# Patient Record
Sex: Male | Born: 1980 | Race: White | Hispanic: No | Marital: Single | Smoking: Never smoker
Health system: Southern US, Community
[De-identification: ages and names within clinical notes are randomized; demographics above are authoritative.]

## PROBLEM LIST (undated history)

## (undated) DIAGNOSIS — F419 Anxiety disorder, unspecified: Secondary | ICD-10-CM

## (undated) DIAGNOSIS — D649 Anemia, unspecified: Secondary | ICD-10-CM

## (undated) DIAGNOSIS — Z8709 Personal history of other diseases of the respiratory system: Secondary | ICD-10-CM

## (undated) DIAGNOSIS — I10 Essential (primary) hypertension: Secondary | ICD-10-CM

## (undated) DIAGNOSIS — R112 Nausea with vomiting, unspecified: Secondary | ICD-10-CM

## (undated) DIAGNOSIS — A498 Other bacterial infections of unspecified site: Secondary | ICD-10-CM

## (undated) DIAGNOSIS — Z87442 Personal history of urinary calculi: Secondary | ICD-10-CM

## (undated) DIAGNOSIS — K589 Irritable bowel syndrome without diarrhea: Secondary | ICD-10-CM

## (undated) DIAGNOSIS — K900 Celiac disease: Secondary | ICD-10-CM

## (undated) DIAGNOSIS — K5790 Diverticulosis of intestine, part unspecified, without perforation or abscess without bleeding: Secondary | ICD-10-CM

## (undated) DIAGNOSIS — Z9889 Other specified postprocedural states: Secondary | ICD-10-CM

## (undated) HISTORY — PX: CHOLECYSTECTOMY: SHX55

## (undated) HISTORY — PX: TONSILLECTOMY: SUR1361

## (undated) HISTORY — PX: LITHOTRIPSY: SUR834

---

## 2002-04-30 ENCOUNTER — Emergency Department (HOSPITAL_COMMUNITY): Admission: EM | Admit: 2002-04-30 | Discharge: 2002-04-30 | Payer: Self-pay | Admitting: Emergency Medicine

## 2002-05-26 ENCOUNTER — Emergency Department (HOSPITAL_COMMUNITY): Admission: EM | Admit: 2002-05-26 | Discharge: 2002-05-26 | Payer: Self-pay | Admitting: Emergency Medicine

## 2002-05-26 ENCOUNTER — Encounter: Payer: Self-pay | Admitting: Emergency Medicine

## 2003-05-01 ENCOUNTER — Emergency Department (HOSPITAL_COMMUNITY): Admission: EM | Admit: 2003-05-01 | Discharge: 2003-05-01 | Payer: Self-pay | Admitting: Emergency Medicine

## 2003-06-24 ENCOUNTER — Ambulatory Visit (HOSPITAL_COMMUNITY): Admission: RE | Admit: 2003-06-24 | Discharge: 2003-06-24 | Payer: Self-pay | Admitting: *Deleted

## 2003-06-24 ENCOUNTER — Encounter (INDEPENDENT_AMBULATORY_CARE_PROVIDER_SITE_OTHER): Payer: Self-pay | Admitting: Specialist

## 2003-06-24 ENCOUNTER — Ambulatory Visit (HOSPITAL_BASED_OUTPATIENT_CLINIC_OR_DEPARTMENT_OTHER): Admission: RE | Admit: 2003-06-24 | Discharge: 2003-06-24 | Payer: Self-pay | Admitting: *Deleted

## 2003-09-22 ENCOUNTER — Emergency Department (HOSPITAL_COMMUNITY): Admission: EM | Admit: 2003-09-22 | Discharge: 2003-09-22 | Payer: Self-pay | Admitting: Emergency Medicine

## 2003-11-26 ENCOUNTER — Emergency Department (HOSPITAL_COMMUNITY): Admission: EM | Admit: 2003-11-26 | Discharge: 2003-11-26 | Payer: Self-pay | Admitting: *Deleted

## 2004-06-14 ENCOUNTER — Emergency Department (HOSPITAL_COMMUNITY): Admission: EM | Admit: 2004-06-14 | Discharge: 2004-06-14 | Payer: Self-pay | Admitting: Emergency Medicine

## 2004-07-06 ENCOUNTER — Emergency Department (HOSPITAL_COMMUNITY): Admission: EM | Admit: 2004-07-06 | Discharge: 2004-07-06 | Payer: Self-pay | Admitting: Family Medicine

## 2004-07-27 ENCOUNTER — Emergency Department (HOSPITAL_COMMUNITY): Admission: EM | Admit: 2004-07-27 | Discharge: 2004-07-27 | Payer: Self-pay | Admitting: Family Medicine

## 2007-02-23 ENCOUNTER — Ambulatory Visit: Payer: Self-pay | Admitting: Emergency Medicine

## 2008-01-18 ENCOUNTER — Ambulatory Visit: Payer: Self-pay | Admitting: Family Medicine

## 2008-10-07 ENCOUNTER — Ambulatory Visit: Payer: Self-pay | Admitting: Nurse Practitioner

## 2008-10-13 ENCOUNTER — Emergency Department: Payer: Self-pay | Admitting: Emergency Medicine

## 2009-02-21 ENCOUNTER — Emergency Department: Payer: Self-pay | Admitting: Emergency Medicine

## 2009-10-12 ENCOUNTER — Emergency Department (HOSPITAL_COMMUNITY): Admission: EM | Admit: 2009-10-12 | Discharge: 2009-10-12 | Payer: Self-pay | Admitting: Emergency Medicine

## 2009-11-09 ENCOUNTER — Ambulatory Visit: Payer: Self-pay | Admitting: Internal Medicine

## 2010-09-08 LAB — D-DIMER, QUANTITATIVE: D-Dimer, Quant: 0.22 ug/mL-FEU (ref 0.00–0.48)

## 2010-11-06 NOTE — Op Note (Signed)
NAME:  Tyler Reynolds, Tyler Reynolds NO.:  [id number]   MEDICAL RECORD NO.:  [id number]                   PATIENT TYPE:  AMB   LOCATION:  DSC                                  FACILITY:  [facility]   PHYSICIAN:  Kathy Breach, M.D.                   DATE OF BIRTH:  26-Oct-1980   DATE OF PROCEDURE:  06/24/2003  DATE OF DISCHARGE:                                 OPERATIVE REPORT   PREOPERATIVE DIAGNOSIS:  Chronic adenotonsillitis.   POSTOPERATIVE DIAGNOSIS:  Chronic adenotonsillitis.   OPERATION PERFORMED:  Adenotonsillectomy.   SURGEON:  Kathy Breach, M.D.   ANESTHESIA:  General orotracheal anesthesia.   DESCRIPTION OF PROCEDURE:  With the patient under general orotracheal  anesthesia, the Crowe-Davis mouth gag was inserted and the patient put in  rose position.  The patient had 2+ enlarged tonsils, deeply cryptic, filled  with multiple tonsilloliths.  Soft palate was intact to normal  visualization.  Mirror examination of the nasopharynx revealed extensive  cobblestoning adenoid tissue present.  The left tonsil was grasped at the  superior pole and removed by electrical dissection obtaining complete  hemostasis with electrocauterization.  The right tonsil was then removed in  similar fashion.  The red rubber catheter would not pass on the left side of  his nose and was passed on the right side and used to elevate the soft  palate.  Under mirror visualization with suction cauterization, the adenoid  tissue was removed with ablation by electrocoagulation maintaining complete  hemostasis.  Blood loss for the procedure estimated at less than 30 mL.  The  patient tolerated the procedure well and was taken to the recovery room in  stable general condition.                                               Kathy Breach, M.D.    Venia Minks  D:  06/24/2003  T:  06/24/2003  Job:  [id number]

## 2011-02-06 ENCOUNTER — Ambulatory Visit: Payer: Self-pay | Admitting: Internal Medicine

## 2011-06-04 ENCOUNTER — Ambulatory Visit: Payer: Self-pay

## 2011-06-24 ENCOUNTER — Ambulatory Visit: Payer: Self-pay | Admitting: Urology

## 2011-07-08 ENCOUNTER — Ambulatory Visit: Payer: Self-pay | Admitting: Urology

## 2011-07-09 ENCOUNTER — Emergency Department: Payer: Self-pay | Admitting: Emergency Medicine

## 2011-07-09 LAB — CBC
HGB: 14 g/dL (ref 13.0–18.0)
MCH: 30.6 pg (ref 26.0–34.0)
MCHC: 35 g/dL (ref 32.0–36.0)
Platelet: 197 10*3/uL (ref 150–440)
RDW: 13.1 % (ref 11.5–14.5)
WBC: 9.4 10*3/uL (ref 3.8–10.6)

## 2011-07-09 LAB — COMPREHENSIVE METABOLIC PANEL
Albumin: 4.4 g/dL (ref 3.4–5.0)
Alkaline Phosphatase: 53 U/L (ref 50–136)
Anion Gap: 13 (ref 7–16)
BUN: 18 mg/dL (ref 7–18)
Calcium, Total: 9 mg/dL (ref 8.5–10.1)
Co2: 26 mmol/L (ref 21–32)
EGFR (African American): >60
EGFR (Non-African Amer.): >60
Osmolality: 292 (ref 275–301)
Potassium: 3.8 mmol/L (ref 3.5–5.1)
SGOT(AST): 30 U/L (ref 15–37)
SGPT (ALT): 56 U/L
Sodium: 145 mmol/L (ref 136–145)

## 2011-07-10 LAB — URINALYSIS, COMPLETE
Glucose,UR: NEGATIVE mg/dL (ref 0–75)
Leukocyte Esterase: NEGATIVE
Ph: 5 (ref 4.5–8.0)
Protein: 30
RBC,UR: 38 /HPF (ref 0–5)
WBC UR: 6 /HPF (ref 0–5)

## 2011-07-15 ENCOUNTER — Ambulatory Visit: Payer: Self-pay | Admitting: Urology

## 2011-11-22 ENCOUNTER — Other Ambulatory Visit: Payer: Self-pay | Admitting: Family Medicine

## 2011-11-22 DIAGNOSIS — R634 Abnormal weight loss: Secondary | ICD-10-CM

## 2011-11-22 DIAGNOSIS — R109 Unspecified abdominal pain: Secondary | ICD-10-CM

## 2011-11-23 ENCOUNTER — Ambulatory Visit
Admission: RE | Admit: 2011-11-23 | Discharge: 2011-11-23 | Disposition: A | Discharge status: Home / Self Care | Payer: 59 | Source: Ambulatory Visit | Attending: Family Medicine | Admitting: Family Medicine

## 2011-11-23 DIAGNOSIS — R634 Abnormal weight loss: Secondary | ICD-10-CM

## 2011-11-23 DIAGNOSIS — R109 Unspecified abdominal pain: Secondary | ICD-10-CM

## 2011-11-23 MED ORDER — IOHEXOL 300 MG/ML  SOLN
125.0000 mL | Freq: Once | INTRAMUSCULAR | Status: AC | PRN
  Administered 2011-11-23: 125 mL via INTRAVENOUS

## 2011-11-23 MED ORDER — IOHEXOL 300 MG/ML  SOLN
20.0000 mL | Freq: Once | INTRAMUSCULAR | Status: AC | PRN
  Administered 2011-11-23: 20 mL via ORAL

## 2011-12-11 ENCOUNTER — Telehealth: Payer: Self-pay

## 2011-12-11 NOTE — Telephone Encounter (Signed)
Yes it is and details were given of the report

## 2011-12-11 NOTE — Telephone Encounter (Signed)
PT WOULD LIKE TO KNOW IF HIS RADIOLOGY REPORT IN YET, PT WAS SEEN HERE FOR WC YESTERDAY.

## 2011-12-19 ENCOUNTER — Ambulatory Visit: Payer: Self-pay

## 2012-01-10 ENCOUNTER — Encounter: Payer: Self-pay | Admitting: Emergency Medicine

## 2012-06-21 DIAGNOSIS — A498 Other bacterial infections of unspecified site: Secondary | ICD-10-CM

## 2012-06-21 HISTORY — DX: Other bacterial infections of unspecified site: A49.8

## 2012-10-26 ENCOUNTER — Encounter (HOSPITAL_COMMUNITY): Payer: Self-pay | Admitting: Nurse Practitioner

## 2012-10-26 ENCOUNTER — Emergency Department (HOSPITAL_COMMUNITY)
Admission: EM | Admit: 2012-10-26 | Discharge: 2012-10-26 | Disposition: A | Discharge status: Home / Self Care | Payer: BC Managed Care – PPO | Attending: Emergency Medicine | Admitting: Emergency Medicine

## 2012-10-26 ENCOUNTER — Emergency Department (HOSPITAL_COMMUNITY): Payer: BC Managed Care – PPO

## 2012-10-26 DIAGNOSIS — Z9089 Acquired absence of other organs: Secondary | ICD-10-CM | POA: Insufficient documentation

## 2012-10-26 DIAGNOSIS — R197 Diarrhea, unspecified: Secondary | ICD-10-CM | POA: Insufficient documentation

## 2012-10-26 DIAGNOSIS — M25529 Pain in unspecified elbow: Secondary | ICD-10-CM | POA: Insufficient documentation

## 2012-10-26 DIAGNOSIS — R109 Unspecified abdominal pain: Secondary | ICD-10-CM

## 2012-10-26 DIAGNOSIS — M25569 Pain in unspecified knee: Secondary | ICD-10-CM | POA: Insufficient documentation

## 2012-10-26 DIAGNOSIS — R6883 Chills (without fever): Secondary | ICD-10-CM | POA: Insufficient documentation

## 2012-10-26 DIAGNOSIS — R112 Nausea with vomiting, unspecified: Secondary | ICD-10-CM | POA: Insufficient documentation

## 2012-10-26 DIAGNOSIS — R5381 Other malaise: Secondary | ICD-10-CM | POA: Insufficient documentation

## 2012-10-26 DIAGNOSIS — Z8719 Personal history of other diseases of the digestive system: Secondary | ICD-10-CM | POA: Insufficient documentation

## 2012-10-26 HISTORY — DX: Celiac disease: K90.0

## 2012-10-26 HISTORY — DX: Diverticulosis of intestine, part unspecified, without perforation or abscess without bleeding: K57.90

## 2012-10-26 HISTORY — DX: Irritable bowel syndrome, unspecified: K58.9

## 2012-10-26 LAB — CBC WITH DIFFERENTIAL/PLATELET
Eosinophils Absolute: 0.2 10*3/uL (ref 0.0–0.7)
Eosinophils Relative: 2 % (ref 0–5)
HCT: 41.8 % (ref 39.0–52.0)
Hemoglobin: 15.3 g/dL (ref 13.0–17.0)
Lymphs Abs: 2.8 10*3/uL (ref 0.7–4.0)
MCH: 31 pg (ref 26.0–34.0)
MCV: 84.6 fL (ref 78.0–100.0)
Monocytes Absolute: 0.7 10*3/uL (ref 0.1–1.0)
Monocytes Relative: 7 % (ref 3–12)
RBC: 4.94 MIL/uL (ref 4.22–5.81)

## 2012-10-26 LAB — URINALYSIS, ROUTINE W REFLEX MICROSCOPIC
Glucose, UA: NEGATIVE mg/dL
Leukocytes, UA: NEGATIVE
Protein, ur: NEGATIVE mg/dL
Specific Gravity, Urine: <1.005 — ABNORMAL LOW (ref 1.005–1.030)
pH: 5.5 (ref 5.0–8.0)

## 2012-10-26 LAB — COMPREHENSIVE METABOLIC PANEL
AST: 32 U/L (ref 0–37)
Albumin: 4.7 g/dL (ref 3.5–5.2)
Calcium: 9.7 mg/dL (ref 8.4–10.5)
Chloride: 103 mEq/L (ref 96–112)
Creatinine, Ser: 1.12 mg/dL (ref 0.50–1.35)
Sodium: 142 mEq/L (ref 135–145)

## 2012-10-26 MED ORDER — IOHEXOL 300 MG/ML  SOLN
50.0000 mL | Freq: Once | INTRAMUSCULAR | Status: AC | PRN
  Administered 2012-10-26: 50 mL via ORAL

## 2012-10-26 MED ORDER — ONDANSETRON HCL 4 MG/2ML IJ SOLN
4.0000 mg | Freq: Once | INTRAMUSCULAR | Status: AC
  Administered 2012-10-26: 4 mg via INTRAVENOUS
  Filled 2012-10-26: qty 2

## 2012-10-26 MED ORDER — PROMETHAZINE HCL 25 MG/ML IJ SOLN
12.5000 mg | Freq: Once | INTRAMUSCULAR | Status: DC
  Filled 2012-10-26: qty 1

## 2012-10-26 MED ORDER — IOHEXOL 300 MG/ML  SOLN
100.0000 mL | Freq: Once | INTRAMUSCULAR | Status: AC | PRN
  Administered 2012-10-26: 100 mL via INTRAVENOUS

## 2012-10-26 MED ORDER — KETOROLAC TROMETHAMINE 30 MG/ML IJ SOLN
30.0000 mg | Freq: Once | INTRAMUSCULAR | Status: AC
  Administered 2012-10-26: 30 mg via INTRAVENOUS
  Filled 2012-10-26: qty 1

## 2012-10-26 MED ORDER — SODIUM CHLORIDE 0.9 % IV BOLUS (SEPSIS)
1000.0000 mL | Freq: Once | INTRAVENOUS | Status: AC
  Administered 2012-10-26: 1000 mL via INTRAVENOUS

## 2012-10-26 NOTE — ED Provider Notes (Signed)
History     CSN: [id number]  Arrival date & time 10/26/12  1256   First MD Initiated Contact with Patient 10/26/12 [phone]      Chief Complaint  Patient presents with  . Abdominal Pain    (Consider location/radiation/quality/duration/timing/severity/associated sxs/prior treatment) HPI Comments: Patient with hx celiac disease, IBS p/w with constant tearing, crampy left sided abdominal pain x 1 week with three episodes of vomiting - emesis described as yellow with red streaks.  Also profuse diarrhea 7-9 times daily.  States diarrhea is very watery, nonbloody.  Associated chills, weakness, decreased appetite, arthralgias (knees and elbows).  Has seen Dr Loreta Ave (GI), and has had recent colonoscopy that he reports was normal.  Hx of diverticulitis that he states felt similar but this episode is worse.  No recent antibiotics.  No sick contacts.   Patient is a 32 y.o. male presenting with abdominal pain. The history is provided by the patient.  Abdominal Pain Associated symptoms: chills, nausea and vomiting   Associated symptoms: no constipation, no diarrhea, no dysuria and no fever     Past Medical History  Diagnosis Date  . Irritable bowel syndrome (IBS)   . Diverticular disease   . Celiac disease     Past Surgical History  Procedure Laterality Date  . Cholecystectomy    . Tonsillectomy      History reviewed. No pertinent family history.  History  Substance Use Topics  . Smoking status: Never Smoker   . Smokeless tobacco: Not on file  . Alcohol Use: No      Review of Systems  Constitutional: Positive for chills. Negative for fever.  Gastrointestinal: Positive for nausea, vomiting and abdominal pain. Negative for diarrhea, constipation and blood in stool.  Genitourinary: Negative for dysuria, urgency, frequency, discharge, scrotal swelling and testicular pain.    Allergies  Antihistamines, diphenhydramine-type and Augmentin  Home Medications   Current Outpatient Rx    Name  Route  Sig  Dispense  Refill  . vitamin B-12 (CYANOCOBALAMIN) 1000 MCG tablet   Oral   Take 1,000 mcg by mouth daily.           BP 156/100  Pulse 67  Temp(Src) 98.5 F (36.9 C) (Oral)  Resp 20  SpO2 100%  Physical Exam  Nursing note and vitals reviewed. Constitutional: He appears well-developed and well-nourished. No distress.  HENT:  Head: Normocephalic and atraumatic.  Neck: Neck supple.  Cardiovascular: Normal rate and regular rhythm.   Pulmonary/Chest: Effort normal and breath sounds normal. No respiratory distress. He has no wheezes. He has no rales.  Abdominal: Soft. Bowel sounds are normal. He exhibits no distension and no mass. There is tenderness. There is no rebound and no guarding.    Neurological: He is alert. He exhibits normal muscle tone.  Skin: He is not diaphoretic.    ED Course  Procedures (including critical care time)  Labs Reviewed  COMPREHENSIVE METABOLIC PANEL - Abnormal; Notable for the following:    GFR calc non Af Amer 86 (*)    All other components within normal limits  CBC WITH DIFFERENTIAL - Abnormal; Notable for the following:    MCHC 36.6 (*)    All other components within normal limits  URINALYSIS, ROUTINE W REFLEX MICROSCOPIC - Abnormal; Notable for the following:    Specific Gravity, Urine <1.005 (*)    All other components within normal limits  CLOSTRIDIUM DIFFICILE BY PCR  STOOL CULTURE  LIPASE, BLOOD  URINALYSIS, MICROSCOPIC ONLY   Ct Abdomen Pelvis  W Contrast  10/26/2012  *RADIOLOGY REPORT*  Clinical Data: Left lower quadrant pain.  Diarrhea.  CT ABDOMEN AND PELVIS WITH CONTRAST  Technique:  Multidetector CT imaging of the abdomen and pelvis was performed following the standard protocol during bolus administration of intravenous contrast.  Contrast: OMNIPAQUE IOHEXOL 300 MG/ML  SOLN  Comparison: CT scan dated 11/23/2011  Findings: The liver, spleen, pancreas, adrenal glands, and left kidney are normal.  There are  several tiny stones in the otherwise normal right kidney.  No hydronephrosis.  No dilated bile ducts.  Gallbladder has been removed.  The bowel appears normal including the terminal ileum and appendix. No free air or free fluid.  No adenopathy.  No significant osseous abnormality.  IMPRESSION: Benign-appearing abdomen and pelvis.   Original Report Authenticated By: Francene Boyers, M.D.      1. Abdominal pain   2. Diarrhea      MDM  Pt with hx celiac disease, irritable bowel syndrome p/w left sided abdominal pain, vomiting x 3 over three days, profuse diarrhea.  No recent antibiotics.  No sick contacts.  Has had these symptoms previously and has had colonoscopy and endoscopy by GI (previously with Dr Loreta Ave, now switching to [facility] arranged by PCP).  Discussed requested CT scan with patient - patient is aware of risks of radiation and increased risk of cancer in the future with repeat scans; however, pt states this pain is different and worse than usual.  Given tenderness and patient's statement that this is significantly different, I have ordered the CT scan.  Labs are unremarkable, CT is normal.  Stool culture, c.diff pending.  Pt d/c home with GI follow up.  Pt has percocet and phenergan at home. Discussed all results with patient.  Pt given return precautions.  Pt verbalizes understanding and agrees with plan.           Trixie Dredge, PA-C 10/26/12 1955

## 2012-10-26 NOTE — ED Notes (Signed)
IV to left forearm discontinued. Cath tip intact and site u. Sterile drsg applied.

## 2012-10-26 NOTE — ED Notes (Signed)
Pt unable to give stool sample at this time.

## 2012-10-26 NOTE — ED Notes (Signed)
Pt reminded need for stool sample.

## 2012-10-26 NOTE — ED Notes (Signed)
Pt reports last week began to have abd pain, then decreased appetite, increased watery BMs, n/v. Reports he saw blood in his stool and vomit. Pt has history IBS, celiac disease, diverticulitis with flare ups.

## 2012-10-26 NOTE — ED Notes (Signed)
Pt finished drinking 1st cup of oral contrast. CT notified. 

## 2012-10-26 NOTE — ED Notes (Signed)
Pt c/o left sided abd cramping, with some immediate diarrhea. Pt c/o n/v, reports he has seen bld in his stool. Pt reports he hasn't been able to eat X 3 days along with explosive diarrhea. Pt reports he went to PCP today, she was concerned because his colonoscopy that was done 3 weeks ago was showing swelling in his colon, pt sts he also had an endoscopy done last year that showed some swelling. Pt reports he gets an awful taste in his mouth a lot. Pt reports he has celiac disease. PCP wants a CT scan of abd.

## 2012-10-26 NOTE — ED Notes (Signed)
Pt returned from radiology.

## 2012-10-27 ENCOUNTER — Telehealth (HOSPITAL_COMMUNITY): Payer: Self-pay | Admitting: Emergency Medicine

## 2012-10-27 NOTE — ED Provider Notes (Signed)
Medical screening examination/treatment/procedure(s) were performed by non-physician practitioner and as supervising physician I was immediately available for consultation/collaboration.   Carleene Cooper III, MD 10/27/12 1146

## 2012-10-27 NOTE — ED Notes (Signed)
Chart returned from EDP office. Per Junious Silk PA-C, Metronidazole PO 500 mg TID x 7 days.

## 2012-10-31 NOTE — ED Notes (Signed)
Post ED Visit - Positive Culture Follow-up  Culture report reviewed by antimicrobial stewardship pharmacist: []  Wes Dulaney, Pharm.D., BCPS [x]  Celedonio Miyamoto, Pharm.D., BCPS []  Georgina Pillion, Pharm.D., BCPS []  [place], [address].D., BCPS, AAHIVP []  Estella Husk, Pharm.D., BCPS, AAHIV  Positive stool culture Treatment ok  Larena Sox 10/31/2012, 6:28 PM

## 2012-12-04 ENCOUNTER — Ambulatory Visit (INDEPENDENT_AMBULATORY_CARE_PROVIDER_SITE_OTHER): Payer: BC Managed Care – PPO | Admitting: Emergency Medicine

## 2012-12-04 VITALS — BP 132/95 | HR 77 | Temp 98.3°F | Resp 18 | Wt 253.0 lb

## 2012-12-04 DIAGNOSIS — R350 Frequency of micturition: Secondary | ICD-10-CM

## 2012-12-04 LAB — POCT UA - MICROSCOPIC ONLY
Casts, Ur, LPF, POC: NEGATIVE
WBC, Ur, HPF, POC: NEGATIVE

## 2012-12-04 LAB — POCT URINALYSIS DIPSTICK
Glucose, UA: NEGATIVE
Nitrite, UA: NEGATIVE
Spec Grav, UA: >=1.03
Urobilinogen, UA: 0.2

## 2012-12-04 LAB — GLUCOSE, POCT (MANUAL RESULT ENTRY): POC Glucose: 82 mg/dl (ref 70–99)

## 2012-12-04 MED ORDER — TAMSULOSIN HCL 0.4 MG PO CAPS: 0.4000 mg | ORAL_CAPSULE | Freq: Every day | ORAL | Status: DC

## 2012-12-04 NOTE — Patient Instructions (Signed)
Overactive Bladder, Adult  The bladder has two functions that are totally opposite of the other. One is to relax and stretch out so it can store urine (fills like a balloon), and the other is to contract and squeeze down so that it can empty the urine that it has stored. Proper functioning of the bladder is a complex mixing of these two functions. The filling and emptying of the bladder can be influenced by:  · The bladder.  · The spinal cord.  · The brain.  · The nerves going to the bladder.  · Other organs that are closely related to the bladder such as prostate in males and the vagina in females.  As your bladder fills with urine, nerve signals are sent from the bladder to the brain to tell you that you may need to urinate. Normal urination requires that the bladder squeeze down with sufficient strength to empty the bladder, but this also requires that the bladder squeeze down sufficiently long to finish the job. In addition the sphincter muscles, which normally keep you from leaking urine, must also relax so that the urine can pass. Coordination between the bladder muscle squeezing down and the sphincter muscles relaxing is required to make everything happen normally.  With an overactive bladder sometimes the muscles of the bladder contract unexpectedly and involuntarily and this causes an urgent need to urinate. The normal response is to try to hold urine in by contracting the sphincter muscles. Sometimes the bladder contracts so strongly that the sphincter muscles cannot stop the urine from passing out and incontinence occurs. This kind of incontinence is called urge incontinence.  Having an overactive bladder can be embarrassing and awkward. It can keep you from living life the way you want to. Many people think it is just something you have to put up with as you grow older or have certain health conditions. In fact, there are treatments that can help make your life easier and more pleasant.  CAUSES   Many  things can cause an overactive bladder. Possibilities include:  · Urinary tract infection or infection of nearby tissues such as the prostate.  · Prostate enlargement.  · In women, multiple pregnancies or surgery on the uterus or urethra.  · Bladder stones, inflammation or tumors.  · Caffeine.  · Alcohol.  · Medications. For example, diuretics (drugs that help the body get rid of extra fluid) increase urine production. Some other medicines must be taken with lots of fluids.  · Muscle or nerve weakness. This might be the result of a spinal cord injury, a stroke, multiple sclerosis or Parkinson's disease.  · Diabetes can cause a high urine volume which fills the bladder so quickly that the normal urge to urinate is triggered very strongly.  SYMPTOMS   · Loss of bladder control. You feel the need to urinate and cannot make your body wait.  · Sudden, strong urges to urinate.  · Urinating 8 or more times a day.  · Waking up to urinate two or more times a night.  DIAGNOSIS   To decide if you have overactive bladder, your healthcare provider will probably:  · Ask about symptoms you have noticed.  · Ask about your overall health. This will include questions about any medications you are taking.  · Do a physical examination. This will help determine if there are obvious blockages or other problems.  · Order some tests. These might include:  · A blood test to check for diabetes or   other health issues that could be contributing to the problem.  · Urine testing. This could measure the flow of urine and the pressure on the bladder.  · A test of your neurological system (the brain, spinal cord and nerves). This is the system that senses the need to urinate. Some of these tests are called flow tests, bladder pressure tests and electrical measurements of the sphincter muscle.  · A bladder test to check whether it is emptying completely when you urinate.  · Cytoscopy. This test uses a thin tube with a tiny camera on it. It offers a  look inside your urethra and bladder to see if there are problems.  · Imaging tests. You might be given a contrast dye and then asked to urinate. X-rays are taken to see how your bladder is working.  TREATMENT   An overactive bladder can be treated in many ways. The treatment will depend on the cause. Whether you have a mild or severe case also makes a difference. Often, treatment can be given in your healthcare provider's office or clinic. Be sure to discuss the different options with your caregiver. They include:  · Behavioral treatments. These do not involve medication or surgery:  · Bladder training. For this, you would follow a schedule to urinate at regular intervals. This helps you learn to control the urge to urinate. At first, you might be asked to wait a few minutes after feeling the urge. In time, you should be able to schedule bathroom visits an hour or more apart.  · Kegel exercises. These exercises strengthen the pelvic floor muscles, which support the bladder. By toning these muscles, they can help control urination, even if the bladder muscles are overactive. A specialist will teach you how to do these exercises correctly. They will require daily practice.  · Weight loss. If you are obese or overweight, losing weight might stop your bladder from being overactive. Talk to your healthcare provider about how many pounds you should lose. Also ask if there is a specific program or method that would work best for you.  · Diet change. This might be suggested if constipation is making your overactive bladder worse. Your healthcare provider or a nutritionist can explain ways to change what you eat to ease constipation. Other people might need to take in less caffeine or alcohol. Sometimes drinking fewer fluids is needed, too.  · Protection. This is not an actual treatment. But, you could wear special pads to take care of any leakage while you wait for other treatments to take effect. This will help you avoid  embarrassment.  · Physical treatments.  · Electrical stimulation. Electrodes will send gentle pulses to the nerves or muscles that help control the bladder. The goal is to strengthen them. Sometimes this is done with the electrodes outside of the body. Or, they might be placed inside the body (implanted). This treatment can take several months to have an effect.  · Medications. These are usually used along with other treatments. Several medicines are available. Some are injected into the muscles involved in urination. Others come in pill form. Medications sometimes prescribed include:  · Anticholinergics. These drugs block the signals that the nerves deliver to the bladder. This keeps it from releasing urine at the wrong time. Researchers think the drugs might help in other ways, too.  · Imipramine. This is an antidepressant. But, it relaxes bladder muscles.  · Botox. This is still experimental. Some people believe that injecting it into the   bladder muscles will relax them so they work more normally. It has also been injected into the sphincter muscle when the sphincter muscle does not open properly. This is a temporary fix, however. Also, it might make matters worse, especially in older people.  · Surgery.  · A device might be implanted to help manage your nerves. It works on the nerves that signal when you need to urinate.  · Surgery is sometimes needed with electrical stimulation. If the electrodes are implanted, this is done through surgery.  · Sometimes repairs need to be made through surgery. For example, the size of the bladder can be changed. This is usually done in severe cases only.  HOME CARE INSTRUCTIONS   · Take any medications your healthcare provider prescribed or suggested. Follow the directions carefully.  · Practice any lifestyle changes that are recommended. These might include:  · Drinking less fluid or drinking at different times of the day. If you need to urinate often during the night, for  example, you may need to stop drinking fluids early in the evening.  · Cutting down on caffeine or alcohol. They can both make an overactive bladder worse. Caffeine is found in coffee, tea and sodas.  · Doing Kegel exercises to strengthen muscles.  · Losing weight, if that is recommended.  · Eating a healthy and balanced diet. This will help you avoid constipation.  · Keep a journal or a log. You might be asked to record how much you drink and when, and also when you feel the need to urinate.  · Learn how to care for implants or other devices, such as pessaries.  SEEK MEDICAL CARE IF:   · Your overactive bladder gets worse.  · You feel increased pain or irritation when you urinate.  · You notice blood in your urine.  · You have questions about any medications or devices that your healthcare provider recommended.  · You notice blood, pus or swelling at the site of any test or treatment procedure.  · You have an oral temperature above 102° F (38.9° C).  SEEK IMMEDIATE MEDICAL CARE IF:   You have an oral temperature above 102° F (38.9° C), not controlled by medicine.  Document Released: 04/03/2009 Document Revised: 08/30/2011 Document Reviewed: 04/03/2009  [employer]® Patient Information ©2014 [employer].

## 2012-12-04 NOTE — Progress Notes (Signed)
  Subjective:    Patient ID: ABBIE Reynolds, male    DOB: February 11, 1981, 32 y.o.   MRN: [id number]  HPI patient enters because for the last few weeks he has been bothered by urinary frequency. He feels the urge to pee and then is able to urinate in small amounts. He does not have a good stream to his urine. He does have a history of chlamydia many years ago but not recently. He has not been sexual active for over 3 months. Patient when sexually active has a SSP. He does have a history of kidney stones.    Review of Systems patient also bothered recently with diarrhea. He is currently under the care of Dr. Dulce Sellar but not on medications for this.     Objective:   Physical Exam patient is an overweight young man who is in no distress. Neck supple. Chest clear. Genital exam reveals a normal circumcised male. Testicles are descended. There is no discharge from the meatus  Results for orders placed in visit on 12/04/12  GLUCOSE, POCT (MANUAL RESULT ENTRY)      Result Value Range   POC Glucose 82  70 - 99 mg/dl  POCT UA - MICROSCOPIC ONLY      Result Value Range   WBC, Ur, HPF, POC neg     RBC, urine, microscopic neg     Bacteria, U Microscopic trace     Mucus, UA positive     Epithelial cells, urine per micros 0-1     Crystals, Ur, HPF, POC neg     Casts, Ur, LPF, POC neg     Yeast, UA neg    POCT URINALYSIS DIPSTICK      Result Value Range   Color, UA yellow     Clarity, UA clear     Glucose, UA neg     Bilirubin, UA neg     Ketones, UA neg     Spec Grav, UA >=1.030     Blood, UA neg     pH, UA 6.0     Protein, UA neg     Urobilinogen, UA 0.2     Nitrite, UA neg     Leukocytes, UA Negative          Assessment & Plan:  Patient presents with urinary urgency and poor stream. Symptoms are consistent with overactive bladder syndrome. We'll check urine, Gen-Probe, and sugar. Will treat with Flomax and a strainer.

## 2012-12-05 LAB — GC/CHLAMYDIA PROBE AMP
CT Probe RNA: NEGATIVE
GC Probe RNA: NEGATIVE

## 2013-05-15 ENCOUNTER — Emergency Department (HOSPITAL_COMMUNITY)
Admission: EM | Admit: 2013-05-15 | Discharge: 2013-05-15 | Disposition: A | Discharge status: Home / Self Care | Payer: BC Managed Care – PPO | Attending: Emergency Medicine | Admitting: Emergency Medicine

## 2013-05-15 ENCOUNTER — Emergency Department (HOSPITAL_COMMUNITY): Payer: BC Managed Care – PPO

## 2013-05-15 ENCOUNTER — Encounter (HOSPITAL_COMMUNITY): Payer: Self-pay | Admitting: Emergency Medicine

## 2013-05-15 DIAGNOSIS — Z8719 Personal history of other diseases of the digestive system: Secondary | ICD-10-CM | POA: Insufficient documentation

## 2013-05-15 DIAGNOSIS — E669 Obesity, unspecified: Secondary | ICD-10-CM | POA: Insufficient documentation

## 2013-05-15 DIAGNOSIS — Z79899 Other long term (current) drug therapy: Secondary | ICD-10-CM | POA: Insufficient documentation

## 2013-05-15 DIAGNOSIS — R1012 Left upper quadrant pain: Secondary | ICD-10-CM | POA: Insufficient documentation

## 2013-05-15 DIAGNOSIS — K921 Melena: Secondary | ICD-10-CM | POA: Insufficient documentation

## 2013-05-15 DIAGNOSIS — R1032 Left lower quadrant pain: Secondary | ICD-10-CM | POA: Insufficient documentation

## 2013-05-15 DIAGNOSIS — R5381 Other malaise: Secondary | ICD-10-CM | POA: Insufficient documentation

## 2013-05-15 DIAGNOSIS — R509 Fever, unspecified: Secondary | ICD-10-CM | POA: Insufficient documentation

## 2013-05-15 DIAGNOSIS — R112 Nausea with vomiting, unspecified: Secondary | ICD-10-CM | POA: Insufficient documentation

## 2013-05-15 DIAGNOSIS — R197 Diarrhea, unspecified: Secondary | ICD-10-CM | POA: Insufficient documentation

## 2013-05-15 DIAGNOSIS — R109 Unspecified abdominal pain: Secondary | ICD-10-CM

## 2013-05-15 DIAGNOSIS — Z792 Long term (current) use of antibiotics: Secondary | ICD-10-CM | POA: Insufficient documentation

## 2013-05-15 LAB — CBC
Hemoglobin: 14.6 g/dL (ref 13.0–17.0)
MCHC: 36.1 g/dL — ABNORMAL HIGH (ref 30.0–36.0)
RDW: 12.4 % (ref 11.5–15.5)

## 2013-05-15 LAB — COMPREHENSIVE METABOLIC PANEL
ALT: 56 U/L — ABNORMAL HIGH (ref 0–53)
AST: 31 U/L (ref 0–37)
Albumin: 4.5 g/dL (ref 3.5–5.2)
Alkaline Phosphatase: 57 U/L (ref 39–117)
Potassium: 3.7 mEq/L (ref 3.5–5.1)
Sodium: 139 mEq/L (ref 135–145)
Total Protein: 7.8 g/dL (ref 6.0–8.3)

## 2013-05-15 LAB — OCCULT BLOOD, POC DEVICE: Fecal Occult Bld: NEGATIVE

## 2013-05-15 MED ORDER — CIPROFLOXACIN HCL 500 MG PO TABS: 500.0000 mg | ORAL_TABLET | Freq: Two times a day (BID) | ORAL | Status: DC

## 2013-05-15 MED ORDER — METRONIDAZOLE 500 MG PO TABS: 500.0000 mg | ORAL_TABLET | Freq: Two times a day (BID) | ORAL | Status: DC

## 2013-05-15 MED ORDER — PROMETHAZINE HCL 25 MG/ML IJ SOLN
25.0000 mg | Freq: Once | INTRAMUSCULAR | Status: AC
  Administered 2013-05-15: 25 mg via INTRAVENOUS
  Filled 2013-05-15 (×2): qty 1

## 2013-05-15 MED ORDER — HYDROCODONE-ACETAMINOPHEN 5-325 MG PO TABS: 1.0000 | ORAL_TABLET | ORAL | Status: DC | PRN

## 2013-05-15 MED ORDER — KETOROLAC TROMETHAMINE 30 MG/ML IJ SOLN
30.0000 mg | Freq: Once | INTRAMUSCULAR | Status: AC
  Administered 2013-05-15: 30 mg via INTRAVENOUS
  Filled 2013-05-15: qty 1

## 2013-05-15 MED ORDER — MORPHINE SULFATE 4 MG/ML IJ SOLN
4.0000 mg | Freq: Once | INTRAMUSCULAR | Status: AC
  Administered 2013-05-15: 4 mg via INTRAVENOUS
  Filled 2013-05-15: qty 1

## 2013-05-15 MED ORDER — SODIUM CHLORIDE 0.9 % IV BOLUS (SEPSIS)
1000.0000 mL | Freq: Once | INTRAVENOUS | Status: AC
  Administered 2013-05-15: 1000 mL via INTRAVENOUS

## 2013-05-15 MED ORDER — IOHEXOL 300 MG/ML  SOLN
100.0000 mL | Freq: Once | INTRAMUSCULAR | Status: AC | PRN
  Administered 2013-05-15: 100 mL via INTRAVENOUS

## 2013-05-15 MED ORDER — ONDANSETRON HCL 4 MG PO TABS: 4.0000 mg | ORAL_TABLET | Freq: Four times a day (QID) | ORAL | Status: DC

## 2013-05-15 MED ORDER — ONDANSETRON HCL 4 MG/2ML IJ SOLN
4.0000 mg | Freq: Once | INTRAMUSCULAR | Status: AC
  Administered 2013-05-15: 4 mg via INTRAVENOUS
  Filled 2013-05-15: qty 2

## 2013-05-15 NOTE — ED Provider Notes (Signed)
Medical screening examination/treatment/procedure(s) were performed by non-physician practitioner and as supervising physician I was immediately available for consultation/collaboration.  EKG Interpretation   None        Shon Baton, MD 05/15/13 2240

## 2013-05-15 NOTE — ED Notes (Signed)
Pt states he has been nausea and vomiting with abdominal pain and bloody diarrhea for two weeks 8/10  Pain and throbs

## 2013-05-15 NOTE — ED Provider Notes (Signed)
CSN: [id number]     Arrival date & time 05/15/13  1914 History   First MD Initiated Contact with Patient 05/15/13 1922     Chief Complaint  Patient presents with  . Abdominal Pain  . Diarrhea  . Emesis   (Consider location/radiation/quality/duration/timing/severity/associated sxs/prior Treatment) HPI Comments: Pt is a 32 y/o male with a PMHx of IBS, diverticular disease and Celiac disease who presents to the ED complaining of abdominal pain x 1 week and diarrhea x 2 weeks. Pt states for the past 2 weeks he has had multiple episodes of watery, yellow, blood-tinged foul smelling diarrhea. 1 week ago developed severe left sided abdominal pain rated 8/10, worse after eating with associated nausea and vomiting. Any time he eats he feels like his bowels are "twisting and ringing out" and immediately has diarrhea. Has been burping more, and states he can "taste the smell of his poop". He was able to keep chili down today. He had not tried any alleviating factors for his symptoms. States he had a low-grade fever of 100 yesterday, beginning to feel fatigued. No recent antibiotic use. Last on abx in June when he tested positive for C-diff. No recent travel or sick contacts.  Patient is a 32 y.o. male presenting with abdominal pain, diarrhea, and vomiting. The history is provided by the patient.  Abdominal Pain Associated symptoms: diarrhea, fatigue, fever, nausea and vomiting   Diarrhea Associated symptoms: abdominal pain, fever and vomiting   Emesis Associated symptoms: abdominal pain and diarrhea     Past Medical History  Diagnosis Date  . Irritable bowel syndrome (IBS)   . Diverticular disease   . Celiac disease    Past Surgical History  Procedure Laterality Date  . Cholecystectomy    . Tonsillectomy     History reviewed. No pertinent family history. History  Substance Use Topics  . Smoking status: Never Smoker   . Smokeless tobacco: Not on file  . Alcohol Use: No    Review of  Systems  Constitutional: Positive for fever and fatigue.  Gastrointestinal: Positive for nausea, vomiting, abdominal pain, diarrhea and blood in stool.  All other systems reviewed and are negative.    Allergies  Antihistamines, diphenhydramine-type and Augmentin  Home Medications   Current Outpatient Rx  Name  Route  Sig  Dispense  Refill  . lactobacillus acidophilus (BACID) TABS tablet   Oral   Take 2 tablets by mouth every morning.         . vitamin B-12 (CYANOCOBALAMIN) 1000 MCG tablet   Oral   Take 1,000 mcg by mouth every morning.          . ciprofloxacin (CIPRO) 500 MG tablet   Oral   Take 1 tablet (500 mg total) by mouth 2 (two) times daily. One po bid x 7 days   14 tablet   0   . HYDROcodone-acetaminophen (NORCO/VICODIN) 5-325 MG per tablet   Oral   Take 1-2 tablets by mouth every 4 (four) hours as needed.   10 tablet   0   . metroNIDAZOLE (FLAGYL) 500 MG tablet   Oral   Take 1 tablet (500 mg total) by mouth 2 (two) times daily. One po bid x 7 days   14 tablet   0   . ondansetron (ZOFRAN) 4 MG tablet   Oral   Take 1 tablet (4 mg total) by mouth every 6 (six) hours.   12 tablet   0    BP 126/87  Pulse 72  Temp(Src) 97.9 F (36.6 C) (Oral)  Resp 16  Ht 5\' 7"  (1.702 m)  Wt 250 lb (113.399 kg)  BMI 39.15 kg/m2  SpO2 97% Physical Exam  Nursing note and vitals reviewed. Constitutional: He is oriented to person, place, and time. He appears well-developed and well-nourished. No distress.  Obese  HENT:  Head: Normocephalic and atraumatic.  Mouth/Throat: Oropharynx is clear and moist.  Eyes: Conjunctivae are normal. No scleral icterus.  Neck: Normal range of motion. Neck supple.  Cardiovascular: Normal rate, regular rhythm and normal heart sounds.   Pulmonary/Chest: Effort normal and breath sounds normal.  Abdominal: Soft. Bowel sounds are normal. He exhibits no distension. There is tenderness in the left upper quadrant and left lower quadrant.  There is guarding. There is no rigidity and no rebound.  No peritoneal signs.  Genitourinary: Rectal exam shows no external hemorrhoid, no internal hemorrhoid, no fissure, no mass and no tenderness. Guaiac negative stool.  No gross blood on exam glove.  Musculoskeletal: Normal range of motion. He exhibits no edema.  Neurological: He is alert and oriented to person, place, and time.  Skin: Skin is warm and dry. He is not diaphoretic.  Psychiatric: He has a normal mood and affect. His behavior is normal.    ED Course  Procedures (including critical care time) Labs Review Labs Reviewed  CBC - Abnormal; Notable for the following:    WBC 10.6 (*)    MCHC 36.1 (*)    All other components within normal limits  COMPREHENSIVE METABOLIC PANEL - Abnormal; Notable for the following:    ALT 56 (*)    GFR calc non Af Amer 81 (*)    All other components within normal limits  CLOSTRIDIUM DIFFICILE BY PCR  LIPASE, BLOOD  OCCULT BLOOD, POC DEVICE   Imaging Review Ct Abdomen Pelvis W Contrast  05/15/2013   CLINICAL DATA:  Nausea and vomiting with abdominal pain and bloody diarrhea for 2 weeks. History of irritable bowel syndrome, diverticular disease, and celiac disease.  EXAM: CT ABDOMEN AND PELVIS WITH CONTRAST  TECHNIQUE: Multidetector CT imaging of the abdomen and pelvis was performed using the standard protocol following bolus administration of intravenous contrast.  CONTRAST:  OMNIPAQUE IOHEXOL 300 MG/ML  SOLN  COMPARISON:  10/26/2012  FINDINGS: The lung bases are clear.  Mild diffuse fatty infiltration of the liver. No focal liver lesions are demonstrated. Spleen size is upper limits of normal. Gallbladder is surgically absent. The pancreas, adrenal glands, abdominal aorta, inferior vena cava, and retroperitoneal lymph nodes are unremarkable. There are 2 punctate size calcifications in the right kidney, likely representing small stones. These are stable since previous study and there is no  evidence of ureteral stone or obstruction. The stomach, small bowel, and colon are mostly decompressed. Stool-filled colon without distention. No free air or free fluid in the abdomen.  Pelvis: Prostate gland is not enlarged. Bladder wall is not thickened. No free or loculated pelvic fluid collections. Appendix is normal. No evidence of diverticulitis. No significant pelvic lymphadenopathy. Mild degenerative changes in the spine. No destructive bone lesions appreciated.  IMPRESSION: Punctate size nonobstructing stones in the right kidney. Diffuse fatty infiltration of the liver. No acute process demonstrated in the abdomen or pelvis. No bowel obstruction.   Electronically Signed   By: Burman Nieves M.D.   On: 05/15/2013 21:40    EKG Interpretation   None       MDM   1. Abdominal pain   2. Diarrhea  3. Nausea and vomiting     Pt with abdominal pain, n/v/d, hx of diverticulitis, c-diff, celiac. Tenderness left side of abdomen, more so LLQ with guarding. No peritoneal signs. Stool negative for occult blood. He is well appearing and in NAD. Labs pending. CT w contrast. Will obtain stool sample. 10:13 PM CT negative for any acute process. He was unable to give stool sample. Given hx of c-diff in June, will treat w flagyl. F/u with his GI specialist Dr. Dulce Sellar. Return precautions given. Patient states understanding of treatment care plan and is agreeable.   Trevor Mace, PA-C 05/15/13 2219

## 2013-05-24 ENCOUNTER — Encounter (HOSPITAL_COMMUNITY): Payer: Self-pay | Admitting: Emergency Medicine

## 2013-05-24 ENCOUNTER — Emergency Department (HOSPITAL_COMMUNITY)
Admission: EM | Admit: 2013-05-24 | Discharge: 2013-05-24 | Disposition: A | Discharge status: Home / Self Care | Payer: BC Managed Care – PPO | Attending: Emergency Medicine | Admitting: Emergency Medicine

## 2013-05-24 DIAGNOSIS — Z8719 Personal history of other diseases of the digestive system: Secondary | ICD-10-CM | POA: Insufficient documentation

## 2013-05-24 DIAGNOSIS — R109 Unspecified abdominal pain: Secondary | ICD-10-CM | POA: Insufficient documentation

## 2013-05-24 DIAGNOSIS — R509 Fever, unspecified: Secondary | ICD-10-CM | POA: Insufficient documentation

## 2013-05-24 DIAGNOSIS — R112 Nausea with vomiting, unspecified: Secondary | ICD-10-CM | POA: Insufficient documentation

## 2013-05-24 DIAGNOSIS — R197 Diarrhea, unspecified: Secondary | ICD-10-CM | POA: Insufficient documentation

## 2013-05-24 LAB — COMPREHENSIVE METABOLIC PANEL
ALT: 43 U/L (ref 0–53)
AST: 25 U/L (ref 0–37)
Albumin: 4.6 g/dL (ref 3.5–5.2)
Alkaline Phosphatase: 53 U/L (ref 39–117)
BUN: 13 mg/dL (ref 6–23)
CO2: 25 mEq/L (ref 19–32)
Calcium: 9.6 mg/dL (ref 8.4–10.5)
Chloride: 104 mEq/L (ref 96–112)
Creatinine, Ser: 1.11 mg/dL (ref 0.50–1.35)
GFR calc Af Amer: >90 mL/min (ref >90)
GFR calc non Af Amer: 86 mL/min — ABNORMAL LOW (ref >90)
Glucose, Bld: 92 mg/dL (ref 70–99)
Potassium: 3.9 mEq/L (ref 3.5–5.1)
Sodium: 140 mEq/L (ref 135–145)
Total Bilirubin: 0.4 mg/dL (ref 0.3–1.2)
Total Protein: 8 g/dL (ref 6.0–8.3)

## 2013-05-24 LAB — CBC WITH DIFFERENTIAL/PLATELET
Basophils Absolute: 0 10*3/uL (ref 0.0–0.1)
Basophils Relative: 0 % (ref 0–1)
Eosinophils Absolute: 0.2 10*3/uL (ref 0.0–0.7)
Eosinophils Relative: 2 % (ref 0–5)
HCT: 42.6 % (ref 39.0–52.0)
Hemoglobin: 15.2 g/dL (ref 13.0–17.0)
Lymphocytes Relative: 28 % (ref 12–46)
Lymphs Abs: 2.4 10*3/uL (ref 0.7–4.0)
MCH: 30.5 pg (ref 26.0–34.0)
MCHC: 35.7 g/dL (ref 30.0–36.0)
MCV: 85.4 fL (ref 78.0–100.0)
Monocytes Absolute: 0.6 10*3/uL (ref 0.1–1.0)
Monocytes Relative: 7 % (ref 3–12)
Neutro Abs: 5.3 10*3/uL (ref 1.7–7.7)
Neutrophils Relative %: 63 % (ref 43–77)
Platelets: 172 10*3/uL (ref 150–400)
RBC: 4.99 MIL/uL (ref 4.22–5.81)
RDW: 12.5 % (ref 11.5–15.5)
WBC: 8.4 10*3/uL (ref 4.0–10.5)

## 2013-05-24 LAB — LIPASE, BLOOD: Lipase: 18 U/L (ref 11–59)

## 2013-05-24 MED ORDER — PROMETHAZINE HCL 25 MG RE SUPP: 25.0000 mg | Freq: Four times a day (QID) | RECTAL | Status: DC | PRN

## 2013-05-24 MED ORDER — ONDANSETRON HCL 4 MG/2ML IJ SOLN
4.0000 mg | Freq: Once | INTRAMUSCULAR | Status: AC
  Administered 2013-05-24: 4 mg via INTRAVENOUS
  Filled 2013-05-24: qty 2

## 2013-05-24 MED ORDER — MORPHINE SULFATE 4 MG/ML IJ SOLN
6.0000 mg | Freq: Once | INTRAMUSCULAR | Status: AC
  Administered 2013-05-24: 6 mg via INTRAVENOUS
  Filled 2013-05-24: qty 2

## 2013-05-24 MED ORDER — SODIUM CHLORIDE 0.9 % IV BOLUS (SEPSIS)
1000.0000 mL | INTRAVENOUS | Status: AC
  Administered 2013-05-24: 1000 mL via INTRAVENOUS

## 2013-05-24 MED ORDER — PROMETHAZINE HCL 25 MG/ML IJ SOLN
12.5000 mg | Freq: Once | INTRAMUSCULAR | Status: AC
  Administered 2013-05-24: 12.5 mg via INTRAVENOUS
  Filled 2013-05-24: qty 1

## 2013-05-24 MED ORDER — DICYCLOMINE HCL 20 MG PO TABS: 20.0000 mg | ORAL_TABLET | Freq: Two times a day (BID) | ORAL | Status: DC

## 2013-05-24 MED ORDER — GLYCOPYRROLATE 0.2 MG/ML IJ SOLN
0.2000 mg | Freq: Once | INTRAMUSCULAR | Status: AC
  Administered 2013-05-24: 0.2 mg via INTRAVENOUS
  Filled 2013-05-24: qty 1

## 2013-05-24 NOTE — ED Provider Notes (Signed)
Medical screening examination/treatment/procedure(s) were performed by non-physician practitioner and as supervising physician I was immediately available for consultation/collaboration.  Lyanne Co, MD 05/24/13 1600

## 2013-05-24 NOTE — Progress Notes (Signed)
   CARE MANAGEMENT ED NOTE 05/24/2013  Patient:  Tyler Reynolds, Tyler Reynolds   Account Number:  [id number]  Date Initiated:  05/24/2013  Documentation initiated by:  Edd Arbour  Subjective/Objective Assessment:   PCP jennifer williard     Subjective/Objective Assessment Detail:     Action/Plan:   epic updated   Action/Plan Detail:   Anticipated DC Date:  05/24/2013     Status Recommendation to Physician:   Result of Recommendation:    Other ED Services  Consult Working Plan      Choice offered to / List presented to:            Status of service:  Completed, signed off  ED Comments:   ED Comments Detail:

## 2013-05-24 NOTE — ED Provider Notes (Signed)
CSN: [id number]     Arrival date & time 05/24/13  1042 History   First MD Initiated Contact with Patient 05/24/13 1057     Chief Complaint  Patient presents with  . Nausea  . Abdominal Pain   (Consider location/radiation/quality/duration/timing/severity/associated sxs/prior Treatment) HPI Pt is a 32yo male wit hhx of IBS, diverticular disease, and celiac disease sent to ED by Dr. [facility] for persistent n/v/d, and abdominal pain, associated with intermittent fever of 100-102. Pt reports being evaluated in ED last week, 11/25, and tx prophylactically for C. Diff as he reports hx of same in June 2014.  Today, he provided stool sample for GI and advised it was negative.  Pt states he cannot keep down his medication, cipro, flagyl or zofran.  States he feels like his stomach is being "rung out and twisted," Pain waxes and wanes, 7/10.  Nothing makes symptoms better. Reports becoming nauseas and vomiting or having diarrhea within of eating.  States there is no blood or bile in emesis but stools are thick, and "very dark brown" with mucous.  No bright red blood.  Unexpected weight loss of 13lbs within 94mo. Pt has hx of cholecystectomy and long hx of GI symptoms, reports normally having 8 BM per day but is not on any GI medications until he was seen last week on 11/25.     Past Medical History  Diagnosis Date  . Irritable bowel syndrome (IBS)   . Diverticular disease   . Celiac disease    Past Surgical History  Procedure Laterality Date  . Cholecystectomy    . Tonsillectomy     History reviewed. No pertinent family history. History  Substance Use Topics  . Smoking status: Never Smoker   . Smokeless tobacco: Not on file  . Alcohol Use: No    Review of Systems  Constitutional: Positive for fever, appetite change and unexpected weight change ( 13lbs in 1 month). Negative for chills and diaphoresis.  Gastrointestinal: Positive for nausea, vomiting, abdominal pain  and diarrhea. Negative for constipation.  All other systems reviewed and are negative.    Allergies  Antihistamines, diphenhydramine-type and Augmentin  Home Medications   Current Outpatient Rx  Name  Route  Sig  Dispense  Refill  . ondansetron (ZOFRAN) 4 MG tablet   Oral   Take 1 tablet (4 mg total) by mouth every 6 (six) hours.   12 tablet   0   . vitamin B-12 (CYANOCOBALAMIN) 1000 MCG tablet   Oral   Take 1,000 mcg by mouth every morning.          . dicyclomine (BENTYL) 20 MG tablet   Oral   Take 1 tablet (20 mg total) by mouth 2 (two) times daily.   20 tablet   0   . promethazine (PHENERGAN) 25 MG suppository   Rectal   Place 1 suppository (25 mg total) rectally every 6 (six) hours as needed for nausea or vomiting.   10 each   0    BP 145/91  Pulse 84  Temp(Src) 98.1 F (36.7 C) (Oral)  Resp 16  Wt 250 lb (113.399 kg)  SpO2 98% Physical Exam  Nursing note and vitals reviewed. Constitutional: He appears well-developed and well-nourished.  Pt lying comfortably in exam bed, NAD. Appears well, non-toxic.  HENT:  Head: Normocephalic and atraumatic.  Eyes: Conjunctivae are normal. No scleral icterus.  Neck: Normal range of motion.  Cardiovascular: Normal rate, regular rhythm and normal heart sounds.  Pulmonary/Chest: Effort normal and breath sounds normal. No respiratory distress. He has no wheezes. He has no rales. He exhibits no tenderness.  Abdominal: Soft. Bowel sounds are normal. He exhibits no distension and no mass. There is tenderness. There is no rebound and no guarding.  Obese abdomen, soft, tender in left side  Musculoskeletal: Normal range of motion.  Neurological: He is alert.  Skin: Skin is warm and dry.    ED Course  Procedures (including critical care time) Labs Review Labs Reviewed  COMPREHENSIVE METABOLIC PANEL - Abnormal; Notable for the following:    GFR calc non Af Amer 86 (*)    All other components within normal limits  CBC  WITH DIFFERENTIAL  LIPASE, BLOOD  OCCULT BLOOD X 1 CARD TO LAB, STOOL  OCCULT BLOOD, POC DEVICE   Imaging Review No results found.  EKG Interpretation   None       MDM   1. Nausea vomiting and diarrhea   2. Abdominal pain    Pt with extensive GI hx sent from [facility], Dr. Dulce Sellar, for further evaluation of abdominal pain, n/v/d. Pt states he cannot keep down his medications but was informed his stool sample was negative for C. Diff today.  Pt had unremarkable Abd CT on 11/25.  Today, vitals: unremarkable, pt afebrile, appears well, non-toxic.  Abdomen: obese, soft, tenderness in LUQ and LLQ w/o rebound, guarding or masses.  Will repeat labs: CBC, CMP, Lipase and tx symptoms: Zofran, Fluids, morphine.  Then reassess.   12:27 PM Pt resting comfortably in exam bed.  Has not vomited while in ED, however still c/o nausea and concern he cannot keep anything down.  CBC: unremarkable.  WBC-8.4.  Hemoccult: negative  Will give 12.5mg  phenergan and robinul then reassess after other labs result.  If labs still unremarkable, will have pt f/u with GI.  1:03 PM  Pt was able to keep down several ounces of water.  After reviewing previous medical records and today's labs, there is no indication for emergent process taking place at this time.  Rx: phenergan suppository and bentyl.  Advised to f/u with Dr. Dulce Sellar, GI, for further evaluation for continued n/v/d.  Return precautions provided. Pt verbalized understanding and agreement with tx plan.     Junius Finner, PA-C 05/24/13 1308

## 2013-05-24 NOTE — ED Notes (Signed)
Water given to patient for fluid challenge.  

## 2013-05-24 NOTE — ED Notes (Signed)
Pt alert, nad, arrives from home, c/o nausea, abd pain, pt admits to "chronic abd issues", seen in ED recently, instructed to come to ED per GI Doctor

## 2013-06-19 ENCOUNTER — Encounter (HOSPITAL_COMMUNITY): Payer: Self-pay | Admitting: Emergency Medicine

## 2013-06-19 ENCOUNTER — Emergency Department (HOSPITAL_COMMUNITY): Payer: BC Managed Care – PPO

## 2013-06-19 ENCOUNTER — Emergency Department (HOSPITAL_COMMUNITY)
Admission: EM | Admit: 2013-06-19 | Discharge: 2013-06-19 | Disposition: A | Discharge status: Home / Self Care | Payer: BC Managed Care – PPO | Attending: Emergency Medicine | Admitting: Emergency Medicine

## 2013-06-19 DIAGNOSIS — G8929 Other chronic pain: Secondary | ICD-10-CM | POA: Insufficient documentation

## 2013-06-19 DIAGNOSIS — R109 Unspecified abdominal pain: Secondary | ICD-10-CM | POA: Insufficient documentation

## 2013-06-19 DIAGNOSIS — Z8719 Personal history of other diseases of the digestive system: Secondary | ICD-10-CM | POA: Insufficient documentation

## 2013-06-19 LAB — URINALYSIS, ROUTINE W REFLEX MICROSCOPIC
Bilirubin Urine: NEGATIVE
Glucose, UA: NEGATIVE mg/dL
Hgb urine dipstick: NEGATIVE
Ketones, ur: NEGATIVE mg/dL
Nitrite: NEGATIVE
Specific Gravity, Urine: 1.025 (ref 1.005–1.030)
pH: 6 (ref 5.0–8.0)

## 2013-06-19 LAB — CBC WITH DIFFERENTIAL/PLATELET
Basophils Absolute: 0 10*3/uL (ref 0.0–0.1)
Eosinophils Absolute: 0.2 10*3/uL (ref 0.0–0.7)
Eosinophils Relative: 2 % (ref 0–5)
Lymphocytes Relative: 28 % (ref 12–46)
Lymphs Abs: 2.7 10*3/uL (ref 0.7–4.0)
MCH: 31.2 pg (ref 26.0–34.0)
MCHC: 36.3 g/dL — ABNORMAL HIGH (ref 30.0–36.0)
MCV: 85.9 fL (ref 78.0–100.0)
Neutrophils Relative %: 62 % (ref 43–77)
Platelets: 187 10*3/uL (ref 150–400)
RBC: 4.68 MIL/uL (ref 4.22–5.81)
RDW: 12.8 % (ref 11.5–15.5)
WBC: 9.6 10*3/uL (ref 4.0–10.5)

## 2013-06-19 LAB — COMPREHENSIVE METABOLIC PANEL
ALT: 48 U/L (ref 0–53)
AST: 26 U/L (ref 0–37)
Alkaline Phosphatase: 52 U/L (ref 39–117)
BUN: 15 mg/dL (ref 6–23)
Calcium: 9.5 mg/dL (ref 8.4–10.5)
GFR calc Af Amer: >90 mL/min (ref >90)
Glucose, Bld: 108 mg/dL — ABNORMAL HIGH (ref 70–99)
Potassium: 3.8 mEq/L (ref 3.7–5.3)
Sodium: 143 mEq/L (ref 137–147)
Total Protein: 7.6 g/dL (ref 6.0–8.3)

## 2013-06-19 MED ORDER — KETOROLAC TROMETHAMINE 60 MG/2ML IM SOLN
60.0000 mg | Freq: Once | INTRAMUSCULAR | Status: AC
  Administered 2013-06-19: 60 mg via INTRAMUSCULAR
  Filled 2013-06-19: qty 2

## 2013-06-19 MED ORDER — ONDANSETRON 4 MG PO TBDP
4.0000 mg | ORAL_TABLET | Freq: Once | ORAL | Status: AC
  Administered 2013-06-19: 4 mg via ORAL
  Filled 2013-06-19: qty 1

## 2013-06-19 NOTE — ED Provider Notes (Signed)
CSN: [id number]     Arrival date & time 06/19/13  0011 History   First MD Initiated Contact with Patient 06/19/13 0559     Chief Complaint  Patient presents with  . Abdominal Pain   (Consider location/radiation/quality/duration/timing/severity/associated sxs/prior Treatment) HPI Comments: Patient presents to the emergency department with chief complaint of left-sided abdominal pain. He states the pain has been going on for the past month or more. He states that the pain comes and goes, and worsened last night. States the pain is mostly located on his left side. He has an extensive GI history, including irritable bowel syndrome, and diverticular disease, and celiac disease. He states the pain is moderate to severe. He does not take pain medicine home, because his friend that'll constipate him. He states that he ran a fever to 100.8 at home. He has been afebrile in the emergency department. He endorses subjective chills and vomiting. He states that he constantly has to use the bathroom, but this is unchanged for the past month. States that he uses the toilet approximately 7 times per day.  The history is provided by the patient. No language interpreter was used.    Past Medical History  Diagnosis Date  . Irritable bowel syndrome (IBS)   . Diverticular disease   . Celiac disease    Past Surgical History  Procedure Laterality Date  . Cholecystectomy    . Tonsillectomy     No family history on file. History  Substance Use Topics  . Smoking status: Never Smoker   . Smokeless tobacco: Not on file  . Alcohol Use: No    Review of Systems  All other systems reviewed and are negative.    Allergies  Antihistamines, diphenhydramine-type and Augmentin  Home Medications   Current Outpatient Rx  Name  Route  Sig  Dispense  Refill  . cyanocobalamin (,VITAMIN B-12,) 1000 MCG/ML injection   Intramuscular   Inject 1,000 mcg into the muscle every 30 (thirty) days.          BP 146/93   Pulse 82  Temp(Src) 98.5 F (36.9 C) (Oral)  Resp 16  Ht 5\' 6"  (1.676 m)  Wt 250 lb (113.399 kg)  BMI 40.37 kg/m2  SpO2 96% Physical Exam  Nursing note and vitals reviewed. Constitutional: He is oriented to person, place, and time. He appears well-developed and well-nourished.  HENT:  Head: Normocephalic and atraumatic.  Eyes: Conjunctivae and EOM are normal. Pupils are equal, round, and reactive to light. Right eye exhibits no discharge. Left eye exhibits no discharge. No scleral icterus.  Neck: Normal range of motion. Neck supple. No JVD present.  Cardiovascular: Normal rate, regular rhythm, normal heart sounds and intact distal pulses.  Exam reveals no gallop and no friction rub.   No murmur heard. Pulmonary/Chest: Effort normal and breath sounds normal. No respiratory distress. He has no wheezes. He has no rales. He exhibits no tenderness.  Abdominal: Soft. He exhibits no distension and no mass. There is tenderness. There is no rebound and no guarding.  Some left-sided abdominal tenderness, no right lower quadrant tenderness, or pain at McBurney's point, no right upper quadrant tenderness, or Murphy's sign, no fluid wave, or signs of peritonitis  Musculoskeletal: Normal range of motion. He exhibits no edema and no tenderness.  Neurological: He is alert and oriented to person, place, and time.  Skin: Skin is warm and dry.  Psychiatric: He has a normal mood and affect. His behavior is normal. Judgment and thought content normal.  ED Course  Procedures (including critical care time)  Results for orders placed during the hospital encounter of 06/19/13  CBC WITH DIFFERENTIAL      Result Value Range   WBC 9.6  4.0 - 10.5 K/uL   RBC 4.68  4.22 - 5.81 MIL/uL   Hemoglobin 14.6  13.0 - 17.0 g/dL   HCT 16.1  09.6 - 04.5 %   MCV 85.9  78.0 - 100.0 fL   MCH 31.2  26.0 - 34.0 pg   MCHC 36.3 (*) 30.0 - 36.0 g/dL   RDW 40.9  81.1 - 91.4 %   Platelets 187  150 - 400 K/uL   Neutrophils  Relative % 62  43 - 77 %   Neutro Abs 5.9  1.7 - 7.7 K/uL   Lymphocytes Relative 28  12 - 46 %   Lymphs Abs 2.7  0.7 - 4.0 K/uL   Monocytes Relative 8  3 - 12 %   Monocytes Absolute 0.8  0.1 - 1.0 K/uL   Eosinophils Relative 2  0 - 5 %   Eosinophils Absolute 0.2  0.0 - 0.7 K/uL   Basophils Relative 0  0 - 1 %   Basophils Absolute 0.0  0.0 - 0.1 K/uL  COMPREHENSIVE METABOLIC PANEL      Result Value Range   Sodium 143  137 - 147 mEq/L   Potassium 3.8  3.7 - 5.3 mEq/L   Chloride 104  96 - 112 mEq/L   CO2 24  19 - 32 mEq/L   Glucose, Bld 108 (*) 70 - 99 mg/dL   BUN 15  6 - 23 mg/dL   Creatinine, Ser 7.82  0.50 - 1.35 mg/dL   Calcium 9.5  8.4 - 95.6 mg/dL   Total Protein 7.6  6.0 - 8.3 g/dL   Albumin 4.3  3.5 - 5.2 g/dL   AST 26  0 - 37 U/L   ALT 48  0 - 53 U/L   Alkaline Phosphatase 52  39 - 117 U/L   Total Bilirubin 0.4  0.3 - 1.2 mg/dL   GFR calc non Af Amer >90  >90 mL/min   GFR calc Af Amer >90  >90 mL/min  URINALYSIS, ROUTINE W REFLEX MICROSCOPIC      Result Value Range   Color, Urine YELLOW  YELLOW   APPearance CLEAR  CLEAR   Specific Gravity, Urine 1.025  1.005 - 1.030   pH 6.0  5.0 - 8.0   Glucose, UA NEGATIVE  NEGATIVE mg/dL   Hgb urine dipstick NEGATIVE  NEGATIVE   Bilirubin Urine NEGATIVE  NEGATIVE   Ketones, ur NEGATIVE  NEGATIVE mg/dL   Protein, ur NEGATIVE  NEGATIVE mg/dL   Urobilinogen, UA 0.2  0.0 - 1.0 mg/dL   Nitrite NEGATIVE  NEGATIVE   Leukocytes, UA NEGATIVE  NEGATIVE   Ct Abdomen Pelvis Wo Contrast  06/19/2013   CLINICAL DATA:  Left flank pain. Nausea and vomiting. Chills. Fever. History of celiac disease.  EXAM: CT ABDOMEN AND PELVIS WITHOUT CONTRAST  TECHNIQUE: Multidetector CT imaging of the abdomen and pelvis was performed following the standard protocol without intravenous contrast.  COMPARISON:  05/15/2013  FINDINGS: The lung bases are clear.  Multiple intrarenal stones bilaterally measuring up to about 3 mm maximal diameter. No pyelocaliectasis or  ureterectasis. No ureteral stones or bladder stones. No bladder wall thickening.  Surgical absence of the gallbladder. Surgical clip adjacent to the posterior liver edge inferiorly. Diffuse fatty infiltration of the liver. The unenhanced  appearance of the spleen, pancreas, adrenal glands, abdominal aorta, inferior vena cava, and retroperitoneal lymph nodes is unremarkable. Stomach and small bowel are decompressed. Stool-filled colon without distention. No free air or free fluid in the abdomen. Abdominal wall musculature appears intact.  Pelvis: The appendix is normal. No diverticulitis. Prostate gland is not enlarged. Bladder wall is not thickened. No free or loculated pelvic fluid collections. Normal alignment of the lumbar spine. With  IMPRESSION: Multiple nonobstructing intrarenal stones bilaterally. No ureteral stones. Fatty infiltration of the liver.   Electronically Signed   By: Burman Nieves M.D.   On: 06/19/2013 06:11      EKG Interpretation   None       MDM   1. Abdominal pain     Patient with chronic abdominal pain. His symptoms have been going on for more than one month. He is followed by gastroenterology. He has an appointment scheduled for one week. He is not in any apparent distress currently, and has labs and CT scan are unremarkable. He states that he is ready to go home. I have advised the patient followup with his gastroenterologist as soon as possible. I also left specific return precautions, including returning for worsening pain, increased fever, hematochezia or hematemesis. Patient understands and agrees with the plan. He is stable and ready for discharge.    Roxy Horseman, PA-C 06/19/13 [phone]

## 2013-06-19 NOTE — ED Notes (Signed)
Patient transported to CT 

## 2013-06-19 NOTE — ED Notes (Signed)
Severe abd pain; left sided; vomited x 2 after dinner; chills; history of kidney stone on right

## 2013-06-19 NOTE — ED Notes (Signed)
Patient with c/o pain to "up under my left ribcage that goes all the way down my side" x 1 month Patient medicated, see MAR Patient denies further needs at this time Side rails up, call bell in reach

## 2013-06-19 NOTE — ED Provider Notes (Signed)
Medical screening examination/treatment/procedure(s) were performed by non-physician practitioner and as supervising physician I was immediately available for consultation/collaboration.    Maylynn Orzechowski, MD 06/19/13 2301 

## 2013-06-19 NOTE — ED Notes (Signed)
Patient back from CT and appears in NAD

## 2013-06-28 ENCOUNTER — Encounter (HOSPITAL_COMMUNITY): Payer: Self-pay | Admitting: *Deleted

## 2013-06-28 ENCOUNTER — Other Ambulatory Visit: Payer: Self-pay | Admitting: Gastroenterology

## 2013-06-28 NOTE — Addendum Note (Signed)
Addended by: Willis ModenaUTLAW, Deetya Drouillard on: 06/28/2013 04:26 PM   Modules accepted: Orders

## 2013-07-02 ENCOUNTER — Encounter (HOSPITAL_COMMUNITY)
Admission: RE | Disposition: A | Discharge status: Home / Self Care | Payer: Self-pay | Source: Ambulatory Visit | Attending: Gastroenterology

## 2013-07-02 ENCOUNTER — Encounter (HOSPITAL_COMMUNITY): Payer: BC Managed Care – PPO | Admitting: Certified Registered Nurse Anesthetist

## 2013-07-02 ENCOUNTER — Encounter (HOSPITAL_COMMUNITY): Payer: Self-pay | Admitting: *Deleted

## 2013-07-02 ENCOUNTER — Ambulatory Visit (HOSPITAL_COMMUNITY): Payer: BC Managed Care – PPO | Admitting: Certified Registered Nurse Anesthetist

## 2013-07-02 ENCOUNTER — Ambulatory Visit (HOSPITAL_COMMUNITY)
Admission: RE | Admit: 2013-07-02 | Discharge: 2013-07-02 | Disposition: A | Discharge status: Home / Self Care | Payer: BC Managed Care – PPO | Source: Ambulatory Visit | Attending: Gastroenterology | Admitting: Gastroenterology

## 2013-07-02 DIAGNOSIS — R509 Fever, unspecified: Secondary | ICD-10-CM | POA: Insufficient documentation

## 2013-07-02 DIAGNOSIS — K259 Gastric ulcer, unspecified as acute or chronic, without hemorrhage or perforation: Secondary | ICD-10-CM | POA: Insufficient documentation

## 2013-07-02 DIAGNOSIS — R112 Nausea with vomiting, unspecified: Secondary | ICD-10-CM | POA: Insufficient documentation

## 2013-07-02 DIAGNOSIS — K2960 Other gastritis without bleeding: Secondary | ICD-10-CM | POA: Insufficient documentation

## 2013-07-02 DIAGNOSIS — R197 Diarrhea, unspecified: Secondary | ICD-10-CM | POA: Insufficient documentation

## 2013-07-02 HISTORY — PX: ESOPHAGOGASTRODUODENOSCOPY (EGD) WITH PROPOFOL: SHX5813

## 2013-07-02 HISTORY — PX: COLONOSCOPY WITH PROPOFOL: SHX5780

## 2013-07-02 SURGERY — ESOPHAGOGASTRODUODENOSCOPY (EGD) WITH PROPOFOL
Anesthesia: Monitor Anesthesia Care

## 2013-07-02 MED ORDER — FENTANYL CITRATE 0.05 MG/ML IJ SOLN
INTRAMUSCULAR | Status: DC | PRN
  Administered 2013-07-02 (×2): 50 ug via INTRAVENOUS

## 2013-07-02 MED ORDER — PROPOFOL 10 MG/ML IV BOLUS
INTRAVENOUS | Status: AC
  Filled 2013-07-02: qty 20

## 2013-07-02 MED ORDER — MIDAZOLAM HCL 5 MG/5ML IJ SOLN
INTRAMUSCULAR | Status: DC | PRN
  Administered 2013-07-02 (×2): 2 mg via INTRAVENOUS

## 2013-07-02 MED ORDER — SODIUM CHLORIDE 0.9 % IV SOLN: INTRAVENOUS | Status: DC

## 2013-07-02 MED ORDER — MIDAZOLAM HCL 2 MG/2ML IJ SOLN
INTRAMUSCULAR | Status: AC
  Filled 2013-07-02: qty 2

## 2013-07-02 MED ORDER — PROPOFOL 10 MG/ML IV BOLUS
INTRAVENOUS | Status: DC | PRN
  Administered 2013-07-02: 20 mg via INTRAVENOUS
  Administered 2013-07-02: 40 mg via INTRAVENOUS
  Administered 2013-07-02: 20 mg via INTRAVENOUS
  Administered 2013-07-02 (×3): 40 mg via INTRAVENOUS

## 2013-07-02 MED ORDER — FENTANYL CITRATE 0.05 MG/ML IJ SOLN
INTRAMUSCULAR | Status: AC
  Filled 2013-07-02: qty 2

## 2013-07-02 MED ORDER — LACTATED RINGERS IV SOLN
INTRAVENOUS | Status: DC
  Administered 2013-07-02: 14:00:00 via INTRAVENOUS
  Administered 2013-07-02: 1000 mL via INTRAVENOUS

## 2013-07-02 SURGICAL SUPPLY — 25 items

## 2013-07-02 NOTE — H&P (Signed)
Patient interval history reviewed.  Patient examined again.  There has been no change from documented H/P dated 06/29/13 (scanned into chart from our office) except as documented above.  Assessment:  1.  Abdominal pain, left-sided 2.  Diarrhea. 3.  Fevers unknown origin.  Plan:    1.  Endoscopy with small bowel biopsies. 2.  Risks (bleeding, infection, bowel perforation that could require surgery, sedation-related changes in cardiopulmonary systems), benefits (identification and possible treatment of source of symptoms, exclusion of certain causes of symptoms), and alternatives (watchful waiting, radiographic imaging studies, empiric medical treatment) of upper endoscopy (EGD) were explained to patient/family in detail and patient wishes to proceed. 3.  Colonoscopy with random colon biopsies. 4.  Risks (bleeding, infection, bowel perforation that could require surgery, sedation-related changes in cardiopulmonary systems), benefits (identification and possible treatment of source of symptoms, exclusion of certain causes of symptoms), and alternatives (watchful waiting, radiographic imaging studies, empiric medical treatment) of colonoscopy were explained to patient/family in detail and patient wishes to proceed.

## 2013-07-02 NOTE — Transfer of Care (Signed)
Immediate Anesthesia Transfer of Care Note  Patient: Tyler Reynolds  Procedure(s) Performed: Procedure(s): ESOPHAGOGASTRODUODENOSCOPY (EGD) WITH PROPOFOL (N/A) COLONOSCOPY WITH PROPOFOL (N/A)  Patient Location: PACU and Endoscopy Unit  Anesthesia Type:MAC  Level of Consciousness: awake, alert , oriented and patient cooperative  Airway & Oxygen Therapy: Patient Spontanous Breathing and Patient connected to nasal cannula oxygen  Post-op Assessment: Report given to PACU RN, Post -op Vital signs reviewed and stable and Patient moving all extremities  Post vital signs: Reviewed and stable  Complications: No apparent anesthesia complications

## 2013-07-02 NOTE — Op Note (Signed)
[facility] [address] [place], [id number]   COLONOSCOPY PROCEDURE REPORT  PATIENT: Tyler AndaKing, [name]  MR#: [id number] BIRTHDATE: 1981/05/22 , 32  yrs. old GENDER: Male ENDOSCOPIST: Willis ModenaWilliam Angelina Venard, MD REFERRED XB:JYNWGNFABY:Jennifer Yehuda MaoWillard PROCEDURE DATE:  07/02/2013 PROCEDURE:   Colonoscopy with biopsy ASA CLASS:   Class II INDICATIONS:abdominal pain, diarrhea. MEDICATIONS: MAC sedation, administered by CRNA  DESCRIPTION OF PROCEDURE:   After the risks benefits and alternatives of the procedure were thoroughly explained, informed consent was obtained.  A digital rectal exam revealed no abnormalities of the rectum.   The Pentax Colonoscope [id number] endoscope was introduced through the anus and advanced to the cecum, which was identified by both the appendix and ileocecal valve. No adverse events experienced.   The quality of the prep was adequate, using Colyte  The instrument was then slowly withdrawn as the colon was fully examined.     Findings:  Digital rectal exam normal.  Prep quality adequate.  No polyps, masses, vascular ectasias, or inflammatory changes were seen.  Normal retroflexed view of the rectum.  Random biopsies throughout the colon of normal-appearing mucosa were taken with cold forceps.             .  The scope was withdrawn and the procedure completed.  ENDOSCOPIC IMPRESSION:     As above.  Normal colon.  RECOMMENDATIONS:     1.  Watch for potential complications of procedure. 2.  Await biopsy results. 3.  If all biopsies are unrevealing, consider 10-day try of rifaximin. 4.  Repeat colonoscopy for screening at age 33. 5.  Follow-up with [facility] in 4-6 weeks.  eSigned:  Willis ModenaWilliam Alain Deschene, MD 07/02/2013 2:34 PM   cc:

## 2013-07-02 NOTE — Discharge Instructions (Signed)
Endoscopy Care After Please read the instructions outlined below and refer to this sheet in the next few weeks. These discharge instructions provide you with general information on caring for yourself after you leave the hospital. Your doctor may also give you specific instructions. While your treatment has been planned according to the most current medical practices available, unavoidable complications occasionally occur. If you have any problems or questions after discharge, please call Dr. Outlaw ([facility]) at [phone].  HOME CARE INSTRUCTIONS Activity You may resume your regular activity but move at a slower pace for the next 24 hours.  Take frequent rest periods for the next 24 hours.  Walking will help expel (get rid of) the air and reduce the bloated feeling in your abdomen.  No driving for 24 hours (because of the anesthesia (medicine) used during the test).  You may shower.  Do not sign any important legal documents or operate any machinery for 24 hours (because of the anesthesia used during the test).  Nutrition Drink plenty of fluids.  You may resume your normal diet.  Begin with a light meal and progress to your normal diet.  Avoid alcoholic beverages for 24 hours or as instructed by your caregiver.  Medications You may resume your normal medications unless your caregiver tells you otherwise. What you can expect today You may experience abdominal discomfort such as a feeling of fullness or "gas" pains.  You may experience a sore throat for 2 to 3 days. This is normal. Gargling with salt water may help this.   SEEK IMMEDIATE MEDICAL CARE IF: You have excessive nausea (feeling sick to your stomach) and/or vomiting.  You have severe abdominal pain and distention (swelling).  You have trouble swallowing.  You have a temperature over 100 F (37.8 C).  You have rectal bleeding or vomiting of blood.  Document Released: 01/20/2004 Document Revised: 02/17/2011  Document Reviewed: 08/02/2007 ExitCare Patient Information 2012 [employer].   Colonoscopy  Post procedure instructions:  Read the instructions outlined below and refer to this sheet in the next few weeks. These discharge instructions provide you with general information on caring for yourself after you leave the hospital. Your doctor may also give you specific instructions. While your treatment has been planned according to the most current medical practices available, unavoidable complications occasionally occur. If you have any problems or questions after discharge, call Dr. Outlaw at [facility] ([phone]).  HOME CARE INSTRUCTIONS  ACTIVITY: You may resume your regular activity, but move at a slower pace for the next 24 hours.  Take frequent rest periods for the next 24 hours.  Walking will help get rid of the air and reduce the bloated feeling in your belly (abdomen).  No driving for 24 hours (because of the medicine (anesthesia) used during the test).  You may shower.  Do not sign any important legal documents or operate any machinery for 24 hours (because of the anesthesia used during the test).  NUTRITION: Drink plenty of fluids.  You may resume your normal diet as instructed by your doctor.  Begin with a light meal and progress to your normal diet. Heavy or fried foods are harder to digest and may make you feel sick to your stomach (nauseated).  Avoid alcoholic beverages for 24 hours or as instructed.  MEDICATIONS: You may resume your normal medications unless your doctor tells you otherwise.  WHAT TO EXPECT TODAY: Some feelings of bloating in the abdomen.  Passage of more gas than usual.  Spotting   of blood in your stool or on the toilet paper.  IF YOU HAD POLYPS REMOVED DURING THE COLONOSCOPY: No aspirin products for 7 days or as instructed.  No alcohol for 7 days or as instructed.  Eat a soft diet for the next 24 hours.   FINDING OUT THE RESULTS OF YOUR  TEST  Not all test results are available during your visit. If your test results are not back during the visit, make an appointment with your caregiver to find out the results. Do not assume everything is normal if you have not heard from your caregiver or the medical facility. It is important for you to follow up on all of your test results.     SEEK IMMEDIATE MEDICAL CARE IF:  You have more than a spotting of blood in your stool.  Your belly is swollen (abdominal distention).  You are nauseated or vomiting.  You have a fever.  You have abdominal pain or discomfort that is severe or gets worse throughout the day.    Document Released: 01/20/2004 Document Revised: 02/17/2011 Document Reviewed: 01/18/2008 ExitCare Patient Information 2012 [employer].  

## 2013-07-02 NOTE — Preoperative (Signed)
Beta Blockers   Reason not to administer Beta Blockers:Not Applicable 

## 2013-07-02 NOTE — Anesthesia Preprocedure Evaluation (Signed)
Anesthesia Evaluation  Patient identified by MRN, date of birth, ID band Patient awake    Reviewed: Allergy & Precautions, H&P , NPO status , Patient's Chart, lab work & pertinent test results  Airway Mallampati: II TM Distance: >3 FB Neck ROM: Full    Dental  (+) Dental Advisory Given   Pulmonary neg pulmonary ROS,  breath sounds clear to auscultation        Cardiovascular negative cardio ROS  Rhythm:Regular Rate:Normal     Neuro/Psych negative neurological ROS  negative psych ROS   GI/Hepatic negative GI ROS, Neg liver ROS,   Endo/Other  negative endocrine ROSMorbid obesity  Renal/GU negative Renal ROS  negative genitourinary   Musculoskeletal negative musculoskeletal ROS (+)   Abdominal   Peds negative pediatric ROS (+)  Hematology negative hematology ROS (+)   Anesthesia Other Findings   Reproductive/Obstetrics negative OB ROS                           Anesthesia Physical Anesthesia Plan  ASA: II  Anesthesia Plan: MAC   Post-op Pain Management:    Induction:   Airway Management Planned:   Additional Equipment:   Intra-op Plan:   Post-operative Plan:   Informed Consent: I have reviewed the patients History and Physical, chart, labs and discussed the procedure including the risks, benefits and alternatives for the proposed anesthesia with the patient or authorized representative who has indicated his/her understanding and acceptance.   Dental advisory given  Plan Discussed with: CRNA  Anesthesia Plan Comments:         Anesthesia Quick Evaluation

## 2013-07-02 NOTE — Anesthesia Postprocedure Evaluation (Signed)
Anesthesia Post Note  Patient: Tyler Reynolds  Procedure(s) Performed: Procedure(s) (LRB): ESOPHAGOGASTRODUODENOSCOPY (EGD) WITH PROPOFOL (N/A) COLONOSCOPY WITH PROPOFOL (N/A)  Anesthesia type: MAC  Patient location: PACU  Post pain: Pain level controlled  Post assessment: Post-op Vital signs reviewed  Last Vitals: BP 133/88  Temp(Src) 36.8 C (Oral)  Resp 11  SpO2 99%  Post vital signs: Reviewed  Level of consciousness: awake  Complications: No apparent anesthesia complications

## 2013-07-02 NOTE — Op Note (Signed)
[facility] [address] [place], [id number]   ENDOSCOPY PROCEDURE REPORT  PATIENT: Tyler Reynolds, Tyler M.  MR#: [id number] BIRTHDATE: 1980-09-02 , 32  yrs. old GENDER: Male ENDOSCOPIST: Willis ModenaWilliam Sianni Cloninger, MD REFERRED BY: PROCEDURE DATE:  07/02/2013 PROCEDURE:  EGD w/ biopsy ASA CLASS:     Class II INDICATIONS:  abdominal pain, diarrhea, nausea, vomiting. MEDICATIONS: MAC sedation, administered by CRNA TOPICAL ANESTHETIC: Cetacaine Spray  DESCRIPTION OF PROCEDURE: After the risks benefits and alternatives of the procedure were thoroughly explained, informed consent was obtained.  The PENTAX GASTOROSCOPE [id number] endoscope was introduced through the mouth and advanced to the second portion of the duodenum     . Without limitations.  The instrument was slowly withdrawn as the mucosa was fully examined.     Findings:  Normal esophagus.  Mild gastritis with erosions in the pre-pyloric antrum, biopsied with cold forceps.  Otherwise normal endoscopy to the second portion of the duodenum.                The scope was then withdrawn from the patient and the procedure completed.  ENDOSCOPIC IMPRESSION:     As above.  Antral gastritis and erosions, otherwise normal endoscopy.  RECOMMENDATIONS:     1.  Watch for potential complications of procedure. 2.  Await biopsy results. 3.  Proceed with colonoscopy today.  eSigned:  Willis ModenaWilliam Linday Rhodes, MD 07/02/2013 2:30 PM   CC:

## 2013-07-03 ENCOUNTER — Encounter (HOSPITAL_COMMUNITY): Payer: Self-pay | Admitting: Gastroenterology

## 2013-11-08 ENCOUNTER — Emergency Department (HOSPITAL_COMMUNITY)
Admission: EM | Admit: 2013-11-08 | Discharge: 2013-11-09 | Disposition: A | Discharge status: Home / Self Care | Payer: BC Managed Care – PPO | Attending: Emergency Medicine | Admitting: Emergency Medicine

## 2013-11-08 ENCOUNTER — Encounter (HOSPITAL_COMMUNITY): Payer: Self-pay | Admitting: Emergency Medicine

## 2013-11-08 DIAGNOSIS — R1032 Left lower quadrant pain: Secondary | ICD-10-CM | POA: Insufficient documentation

## 2013-11-08 DIAGNOSIS — N189 Chronic kidney disease, unspecified: Secondary | ICD-10-CM | POA: Insufficient documentation

## 2013-11-08 DIAGNOSIS — R109 Unspecified abdominal pain: Secondary | ICD-10-CM

## 2013-11-08 DIAGNOSIS — Z79899 Other long term (current) drug therapy: Secondary | ICD-10-CM | POA: Insufficient documentation

## 2013-11-08 DIAGNOSIS — R197 Diarrhea, unspecified: Secondary | ICD-10-CM | POA: Insufficient documentation

## 2013-11-08 DIAGNOSIS — R112 Nausea with vomiting, unspecified: Secondary | ICD-10-CM | POA: Insufficient documentation

## 2013-11-08 DIAGNOSIS — Z8719 Personal history of other diseases of the digestive system: Secondary | ICD-10-CM | POA: Insufficient documentation

## 2013-11-08 LAB — CBC WITH DIFFERENTIAL/PLATELET
BASOS ABS: 0 10*3/uL (ref 0.0–0.1)
BASOS PCT: 0 % (ref 0–1)
Eosinophils Absolute: 0.2 10*3/uL (ref 0.0–0.7)
Eosinophils Relative: 3 % (ref 0–5)
HCT: 40.1 % (ref 39.0–52.0)
HEMOGLOBIN: 14.4 g/dL (ref 13.0–17.0)
Lymphocytes Relative: 32 % (ref 12–46)
Lymphs Abs: 2.7 10*3/uL (ref 0.7–4.0)
MCH: 30.6 pg (ref 26.0–34.0)
MCHC: 35.9 g/dL (ref 30.0–36.0)
MCV: 85.3 fL (ref 78.0–100.0)
Monocytes Absolute: 0.6 10*3/uL (ref 0.1–1.0)
Monocytes Relative: 8 % (ref 3–12)
NEUTROS ABS: 4.9 10*3/uL (ref 1.7–7.7)
NEUTROS PCT: 57 % (ref 43–77)
Platelets: 190 10*3/uL (ref 150–400)
RBC: 4.7 MIL/uL (ref 4.22–5.81)
RDW: 12.4 % (ref 11.5–15.5)
WBC: 8.5 10*3/uL (ref 4.0–10.5)

## 2013-11-08 LAB — LIPASE, BLOOD: Lipase: 22 U/L (ref 11–59)

## 2013-11-08 LAB — COMPREHENSIVE METABOLIC PANEL
ALBUMIN: 4.3 g/dL (ref 3.5–5.2)
ALK PHOS: 63 U/L (ref 39–117)
ALT: 58 U/L — AB (ref 0–53)
AST: 34 U/L (ref 0–37)
BILIRUBIN TOTAL: 0.3 mg/dL (ref 0.3–1.2)
BUN: 11 mg/dL (ref 6–23)
CO2: 27 mEq/L (ref 19–32)
Calcium: 9.7 mg/dL (ref 8.4–10.5)
Chloride: 100 mEq/L (ref 96–112)
Creatinine, Ser: 1.08 mg/dL (ref 0.50–1.35)
GFR calc Af Amer: >90 mL/min (ref >90)
GFR calc non Af Amer: 89 mL/min — ABNORMAL LOW (ref >90)
Glucose, Bld: 107 mg/dL — ABNORMAL HIGH (ref 70–99)
POTASSIUM: 3.9 meq/L (ref 3.7–5.3)
SODIUM: 143 meq/L (ref 137–147)
Total Protein: 7.7 g/dL (ref 6.0–8.3)

## 2013-11-08 LAB — URINALYSIS, ROUTINE W REFLEX MICROSCOPIC
Bilirubin Urine: NEGATIVE
GLUCOSE, UA: NEGATIVE mg/dL
HGB URINE DIPSTICK: NEGATIVE
Ketones, ur: NEGATIVE mg/dL
Leukocytes, UA: NEGATIVE
NITRITE: NEGATIVE
PH: 6 (ref 5.0–8.0)
PROTEIN: NEGATIVE mg/dL
Specific Gravity, Urine: 1.015 (ref 1.005–1.030)
UROBILINOGEN UA: 0.2 mg/dL (ref 0.0–1.0)

## 2013-11-08 MED ORDER — HYDROMORPHONE HCL PF 1 MG/ML IJ SOLN
1.0000 mg | INTRAMUSCULAR | Status: DC | PRN
  Administered 2013-11-08 (×2): 1 mg via INTRAVENOUS
  Filled 2013-11-08 (×3): qty 1

## 2013-11-08 MED ORDER — SODIUM CHLORIDE 0.9 % IV SOLN
1000.0000 mL | INTRAVENOUS | Status: DC
  Administered 2013-11-08: 1000 mL via INTRAVENOUS

## 2013-11-08 MED ORDER — ONDANSETRON HCL 4 MG/2ML IJ SOLN: 4.0000 mg | Freq: Once | INTRAMUSCULAR | Status: DC

## 2013-11-08 MED ORDER — SODIUM CHLORIDE 0.9 % IV SOLN
1000.0000 mL | Freq: Once | INTRAVENOUS | Status: AC
  Administered 2013-11-08: 1000 mL via INTRAVENOUS

## 2013-11-08 MED ORDER — ONDANSETRON HCL 4 MG/2ML IJ SOLN
4.0000 mg | Freq: Once | INTRAMUSCULAR | Status: AC
  Administered 2013-11-08: 4 mg via INTRAVENOUS
  Filled 2013-11-08: qty 2

## 2013-11-08 NOTE — ED Provider Notes (Signed)
CSN: [id number]     Arrival date & time 11/08/13  2116 History   First MD Initiated Contact with Patient 11/08/13 2138     Chief Complaint  Patient presents with  . Abdominal Pain     Patient is a 33 y.o. male presenting with abdominal pain. The history is provided by the patient.  Abdominal Pain Pain location:  LLQ Pain quality: burning   Pain radiation: it radiates through his abdomen. Pain severity:  Moderate Timing:  Constant Progression:  Worsening Associated symptoms: diarrhea, nausea and vomiting   Associated symptoms: no dysuria   Pt has history of celiac disease and IBS.  He had been doing well until these symptoms the last four days.  The pain on his left side is worse in one spot.  He has not been able to keep fluids down the last couple of days.  Past Medical History  Diagnosis Date  . Irritable bowel syndrome (IBS)   . Diverticular disease   . Celiac disease   . Chronic kidney disease     hx stones   Past Surgical History  Procedure Laterality Date  . Cholecystectomy    . Tonsillectomy    . Esophagogastroduodenoscopy (egd) with propofol N/A 07/02/2013    Procedure: ESOPHAGOGASTRODUODENOSCOPY (EGD) WITH PROPOFOL;  Surgeon: Willis ModenaWilliam Outlaw, MD;  Location: [facility];  Service: Endoscopy;  Laterality: N/A;  . Colonoscopy with propofol N/A 07/02/2013    Procedure: COLONOSCOPY WITH PROPOFOL;  Surgeon: Willis ModenaWilliam Outlaw, MD;  Location: [facility];  Service: Endoscopy;  Laterality: N/A;   No family history on file. History  Substance Use Topics  . Smoking status: Never Smoker   . Smokeless tobacco: Never Used  . Alcohol Use: No     Comment: rare social    Review of Systems  Gastrointestinal: Positive for nausea, vomiting, abdominal pain and diarrhea.  Genitourinary: Negative for dysuria.  All other systems reviewed and are negative.     Allergies  Antihistamines, diphenhydramine-type and Augmentin  Home Medications   Prior to Admission medications    Medication Sig Start Date End Date Taking? Authorizing Provider  cyanocobalamin (,VITAMIN B-12,) 1000 MCG/ML injection Inject 1,000 mcg into the muscle every 30 (thirty) days.   Yes Historical Provider, MD  lamoTRIgine (LAMICTAL) 100 MG tablet Take 100 mg by mouth daily.   Yes Historical Provider, MD  lipase/protease/amylase (CREON-12/PANCREASE) 12000 UNITS CPEP capsule Take 1-2 capsules by mouth as directed. 2 pills with a full meal. 1 pill with partial meal or snack.   Yes Historical Provider, MD  QUEtiapine (SEROQUEL) 50 MG tablet Take 200 mg by mouth at bedtime.    Yes Historical Provider, MD   BP 151/93  Pulse 75  Temp(Src) 98 F (36.7 C) (Oral)  Resp 20  SpO2 98% Physical Exam  Nursing note and vitals reviewed. Constitutional: He appears well-developed and well-nourished. No distress.  HENT:  Head: Normocephalic and atraumatic.  Right Ear: External ear normal.  Left Ear: External ear normal.  Eyes: Conjunctivae are normal. Right eye exhibits no discharge. Left eye exhibits no discharge. No scleral icterus.  Neck: Neck supple. No tracheal deviation present.  Cardiovascular: Normal rate, regular rhythm and intact distal pulses.   Pulmonary/Chest: Effort normal and breath sounds normal. No stridor. No respiratory distress. He has no wheezes. He has no rales.  Abdominal: Soft. Bowel sounds are normal. He exhibits no distension. There is tenderness in the left lower quadrant. There is no rebound and no guarding. No hernia.  Musculoskeletal: He exhibits  no edema and no tenderness.  Neurological: He is alert. He has normal strength. No cranial nerve deficit (no facial droop, extraocular movements intact, no slurred speech) or sensory deficit. He exhibits normal muscle tone. He displays no seizure activity. Coordination normal.  Skin: Skin is warm and dry. No rash noted.  Psychiatric: He has a normal mood and affect.    ED Course  Procedures (including critical care time) Labs  Review Labs Reviewed  COMPREHENSIVE METABOLIC PANEL - Abnormal; Notable for the following:    Glucose, Bld 107 (*)    ALT 58 (*)    GFR calc non Af Amer 89 (*)    All other components within normal limits  CBC WITH DIFFERENTIAL  LIPASE, BLOOD  URINALYSIS, ROUTINE W REFLEX MICROSCOPIC     MDM   Final diagnoses:  Abdominal pain    Pt has had issues with recurrent abdominal pain.  Has history of IBS.  Several days of symptoms.   Nl wbc. Afebrile.  Tolerating PO.  Doubt diverticulitis, renal colic.   Will dc home with fluids, antispasmodics.  Return to ED for recheck 12-24 hours if symptoms persist, or not improving.  Sooner fever worsening symptoms   Linwood DibblesJon Dmauri Rosenow, MD 11/09/13 [phone]

## 2013-11-08 NOTE — ED Notes (Addendum)
Pt reports left flank/abdominal pain that started 3-4 days ago. Pt reports a history of diverticulitis, pt reports the pain is differnt, pt reports having a GI provider, which he was seen there two months ago. Pt reports nausea, emesis, and diarrhea. Pt is A/O x4 and vitals are WDL.

## 2013-11-09 MED ORDER — DICYCLOMINE HCL 20 MG PO TABS: 20.0000 mg | ORAL_TABLET | Freq: Two times a day (BID) | ORAL | Status: DC

## 2013-11-09 MED ORDER — PROMETHAZINE HCL 25 MG PO TABS: 25.0000 mg | ORAL_TABLET | Freq: Four times a day (QID) | ORAL | Status: DC | PRN

## 2013-11-09 MED ORDER — DICYCLOMINE HCL 20 MG PO TABS: 20.0000 mg | ORAL_TABLET | Freq: Three times a day (TID) | ORAL | Status: DC

## 2013-11-09 NOTE — Discharge Instructions (Signed)
Abdominal Pain, Adult °Many things can cause abdominal pain. Usually, abdominal pain is not caused by a disease and will improve without treatment. It can often be observed and treated at home. Your health care provider will do a physical exam and possibly order blood tests and X-rays to help determine the seriousness of your pain. However, in many cases, more time must pass before a clear cause of the pain can be found. Before that point, your health care provider may not know if you need more testing or further treatment. °HOME CARE INSTRUCTIONS  °Monitor your abdominal pain for any changes. The following actions may help to alleviate any discomfort you are experiencing: °· Only take over-the-counter or prescription medicines as directed by your health care provider. °· Do not take laxatives unless directed to do so by your health care provider. °· Try a clear liquid diet (broth, tea, or water) as directed by your health care provider. Slowly move to a bland diet as tolerated. °SEEK MEDICAL CARE IF: °· You have unexplained abdominal pain. °· You have abdominal pain associated with nausea or diarrhea. °· You have pain when you urinate or have a bowel movement. °· You experience abdominal pain that wakes you in the night. °· You have abdominal pain that is worsened or improved by eating food. °· You have abdominal pain that is worsened with eating fatty foods. °SEEK IMMEDIATE MEDICAL CARE IF:  °· Your pain does not go away within 2 hours. °· You have a fever. °· You keep throwing up (vomiting). °· Your pain is felt only in portions of the abdomen, such as the right side or the left lower portion of the abdomen. °· You pass bloody or black tarry stools. °MAKE SURE YOU: °· Understand these instructions.   °· Will watch your condition.   °· Will get help right away if you are not doing well or get worse.   °Document Released: 03/17/2005 Document Revised: 03/28/2013 Document Reviewed: 02/14/2013 °ExitCare® Patient  Information ©2014 [employer]. ° °

## 2013-12-26 ENCOUNTER — Encounter (HOSPITAL_COMMUNITY): Payer: Self-pay | Admitting: Emergency Medicine

## 2013-12-26 ENCOUNTER — Emergency Department (HOSPITAL_COMMUNITY): Payer: BC Managed Care – PPO

## 2013-12-26 ENCOUNTER — Emergency Department (HOSPITAL_COMMUNITY)
Admission: EM | Admit: 2013-12-26 | Discharge: 2013-12-27 | Disposition: A | Discharge status: Home / Self Care | Payer: BC Managed Care – PPO | Attending: Emergency Medicine | Admitting: Emergency Medicine

## 2013-12-26 DIAGNOSIS — N189 Chronic kidney disease, unspecified: Secondary | ICD-10-CM | POA: Insufficient documentation

## 2013-12-26 DIAGNOSIS — R1031 Right lower quadrant pain: Secondary | ICD-10-CM | POA: Insufficient documentation

## 2013-12-26 DIAGNOSIS — Z79899 Other long term (current) drug therapy: Secondary | ICD-10-CM | POA: Insufficient documentation

## 2013-12-26 DIAGNOSIS — K589 Irritable bowel syndrome without diarrhea: Secondary | ICD-10-CM | POA: Insufficient documentation

## 2013-12-26 DIAGNOSIS — R000 Tachycardia, unspecified: Secondary | ICD-10-CM | POA: Insufficient documentation

## 2013-12-26 DIAGNOSIS — R109 Unspecified abdominal pain: Secondary | ICD-10-CM

## 2013-12-26 LAB — COMPREHENSIVE METABOLIC PANEL
ALT: 80 U/L — ABNORMAL HIGH (ref 0–53)
AST: 51 U/L — ABNORMAL HIGH (ref 0–37)
Albumin: 4.3 g/dL (ref 3.5–5.2)
Alkaline Phosphatase: 67 U/L (ref 39–117)
Anion gap: 16 — ABNORMAL HIGH (ref 5–15)
BILIRUBIN TOTAL: 0.4 mg/dL (ref 0.3–1.2)
BUN: 12 mg/dL (ref 6–23)
CHLORIDE: 98 meq/L (ref 96–112)
CO2: 24 meq/L (ref 19–32)
Calcium: 9.6 mg/dL (ref 8.4–10.5)
Creatinine, Ser: 1.08 mg/dL (ref 0.50–1.35)
GFR, EST NON AFRICAN AMERICAN: 89 mL/min — AB (ref >90)
Glucose, Bld: 114 mg/dL — ABNORMAL HIGH (ref 70–99)
Potassium: 3.2 mEq/L — ABNORMAL LOW (ref 3.7–5.3)
SODIUM: 138 meq/L (ref 137–147)
Total Protein: 7.4 g/dL (ref 6.0–8.3)

## 2013-12-26 LAB — URINALYSIS, ROUTINE W REFLEX MICROSCOPIC
BILIRUBIN URINE: NEGATIVE
GLUCOSE, UA: NEGATIVE mg/dL
HGB URINE DIPSTICK: NEGATIVE
Ketones, ur: NEGATIVE mg/dL
Leukocytes, UA: NEGATIVE
Nitrite: NEGATIVE
PH: 6 (ref 5.0–8.0)
PROTEIN: NEGATIVE mg/dL
Specific Gravity, Urine: 1.022 (ref 1.005–1.030)
Urobilinogen, UA: 0.2 mg/dL (ref 0.0–1.0)

## 2013-12-26 LAB — CBC WITH DIFFERENTIAL/PLATELET
Basophils Absolute: 0 10*3/uL (ref 0.0–0.1)
Basophils Relative: 0 % (ref 0–1)
EOS PCT: 3 % (ref 0–5)
Eosinophils Absolute: 0.2 10*3/uL (ref 0.0–0.7)
HEMATOCRIT: 39 % (ref 39.0–52.0)
Hemoglobin: 14.1 g/dL (ref 13.0–17.0)
LYMPHS ABS: 2.3 10*3/uL (ref 0.7–4.0)
LYMPHS PCT: 30 % (ref 12–46)
MCH: 30.7 pg (ref 26.0–34.0)
MCHC: 36.2 g/dL — ABNORMAL HIGH (ref 30.0–36.0)
MCV: 84.8 fL (ref 78.0–100.0)
Monocytes Absolute: 0.7 10*3/uL (ref 0.1–1.0)
Monocytes Relative: 8 % (ref 3–12)
NEUTROS ABS: 4.6 10*3/uL (ref 1.7–7.7)
Neutrophils Relative %: 59 % (ref 43–77)
PLATELETS: 194 10*3/uL (ref 150–400)
RBC: 4.6 MIL/uL (ref 4.22–5.81)
RDW: 13 % (ref 11.5–15.5)
WBC: 7.9 10*3/uL (ref 4.0–10.5)

## 2013-12-26 LAB — LIPASE, BLOOD: Lipase: 20 U/L (ref 11–59)

## 2013-12-26 MED ORDER — HYDROCODONE-ACETAMINOPHEN 5-325 MG PO TABS
1.0000 | ORAL_TABLET | Freq: Four times a day (QID) | ORAL | Status: DC | PRN
Start: 2013-12-26 — End: 2015-02-17

## 2013-12-26 MED ORDER — ONDANSETRON HCL 4 MG/2ML IJ SOLN
4.0000 mg | Freq: Once | INTRAMUSCULAR | Status: AC
  Administered 2013-12-26: 4 mg via INTRAVENOUS
  Filled 2013-12-26: qty 2

## 2013-12-26 MED ORDER — HYDROMORPHONE HCL PF 1 MG/ML IJ SOLN
1.0000 mg | Freq: Once | INTRAMUSCULAR | Status: AC
  Administered 2013-12-26: 1 mg via INTRAVENOUS
  Filled 2013-12-26: qty 1

## 2013-12-26 MED ORDER — IOHEXOL 300 MG/ML  SOLN
100.0000 mL | Freq: Once | INTRAMUSCULAR | Status: AC | PRN
  Administered 2013-12-26: 100 mL via INTRAVENOUS

## 2013-12-26 MED ORDER — ONDANSETRON 8 MG PO TBDP
8.0000 mg | ORAL_TABLET | Freq: Once | ORAL | Status: AC
  Administered 2013-12-26: 8 mg via ORAL
  Filled 2013-12-26: qty 1

## 2013-12-26 MED ORDER — SODIUM CHLORIDE 0.9 % IV BOLUS (SEPSIS)
1000.0000 mL | Freq: Once | INTRAVENOUS | Status: AC
  Administered 2013-12-26: 1000 mL via INTRAVENOUS

## 2013-12-26 MED ORDER — ONDANSETRON HCL 4 MG PO TABS: 4.0000 mg | ORAL_TABLET | Freq: Four times a day (QID) | ORAL | Status: DC

## 2013-12-26 NOTE — ED Provider Notes (Signed)
Medical screening examination/treatment/procedure(s) were performed by non-physician practitioner and as supervising physician I was immediately available for consultation/collaboration.   EKG Interpretation None        Dagmar HaitWilliam Shima Compere, MD 12/26/13 2340

## 2013-12-26 NOTE — Discharge Instructions (Signed)

## 2013-12-26 NOTE — ED Provider Notes (Signed)
CSN: [id number]     Arrival date & time 12/26/13  2043 History   First MD Initiated Contact with Patient 12/26/13 2105     Chief Complaint  Patient presents with  . Emesis  . Diarrhea  . Abdominal Pain     (Consider location/radiation/quality/duration/timing/severity/associated sxs/prior Treatment) HPI Comments: Patient presents emergency department with chief complaint of severe right-sided lower cautery and abdominal pain. Pain started yesterday. It is progressively worsened. States the pain is worsened with palpation, movement, and jarring motions, such as bumps in the road. States pain is 10 out of 10. He reports associated fevers, chills, nausea, vomiting, and diarrhea. He states that he has had chronic diarrhea in the past, but this does not feel the same. He reports past abdominal surgical history remarkable for cholecystitis. He is taken anything to alleviate her symptoms.  The history is provided by the patient. No language interpreter was used.    Past Medical History  Diagnosis Date  . Irritable bowel syndrome (IBS)   . Diverticular disease   . Celiac disease   . Chronic kidney disease     hx stones   Past Surgical History  Procedure Laterality Date  . Cholecystectomy    . Tonsillectomy    . Esophagogastroduodenoscopy (egd) with propofol N/A 07/02/2013    Procedure: ESOPHAGOGASTRODUODENOSCOPY (EGD) WITH PROPOFOL;  Surgeon: Willis ModenaWilliam Outlaw, MD;  Location: [facility];  Service: Endoscopy;  Laterality: N/A;  . Colonoscopy with propofol N/A 07/02/2013    Procedure: COLONOSCOPY WITH PROPOFOL;  Surgeon: Willis ModenaWilliam Outlaw, MD;  Location: [facility];  Service: Endoscopy;  Laterality: N/A;   No family history on file. History  Substance Use Topics  . Smoking status: Never Smoker   . Smokeless tobacco: Never Used  . Alcohol Use: No     Comment: rare social    Review of Systems  All other systems reviewed and are negative.     Allergies  Antihistamines,  diphenhydramine-type and Augmentin  Home Medications   Prior to Admission medications   Medication Sig Start Date End Date Taking? Authorizing Provider  cyanocobalamin (,VITAMIN B-12,) 1000 MCG/ML injection Inject 1,000 mcg into the muscle every 30 (thirty) days.    Historical Provider, MD  dicyclomine (BENTYL) 20 MG tablet Take 1 tablet (20 mg total) by mouth 3 (three) times daily before meals. 11/09/13   Linwood DibblesJon Knapp, MD  lamoTRIgine (LAMICTAL) 100 MG tablet Take 100 mg by mouth daily.    Historical Provider, MD  lipase/protease/amylase (CREON-12/PANCREASE) 12000 UNITS CPEP capsule Take 1-2 capsules by mouth as directed. 2 pills with a full meal. 1 pill with partial meal or snack.    Historical Provider, MD  promethazine (PHENERGAN) 25 MG tablet Take 1 tablet (25 mg total) by mouth every 6 (six) hours as needed for nausea or vomiting. 11/09/13   Linwood DibblesJon Knapp, MD  QUEtiapine (SEROQUEL) 50 MG tablet Take 200 mg by mouth at bedtime.     Historical Provider, MD   BP 179/107  Pulse 96  Temp(Src) 98.3 F (36.8 C) (Oral)  Resp 20  Ht 5\' 7"  (1.702 m)  Wt 255 lb (115.667 kg)  BMI 39.93 kg/m2  SpO2 99% Physical Exam  Nursing note and vitals reviewed. Constitutional: He is oriented to person, place, and time. He appears well-developed and well-nourished.  HENT:  Head: Normocephalic and atraumatic.  Eyes: Conjunctivae and EOM are normal. Pupils are equal, round, and reactive to light. Right eye exhibits no discharge. Left eye exhibits no discharge. No scleral icterus.  Neck:  Normal range of motion. Neck supple. No JVD present.  Cardiovascular: Regular rhythm and normal heart sounds.  Exam reveals no gallop and no friction rub.   No murmur heard. Tachycardic on my exam  Pulmonary/Chest: Effort normal and breath sounds normal. No respiratory distress. He has no wheezes. He has no rales. He exhibits no tenderness.  Abdominal: Soft. He exhibits no distension and no mass. There is tenderness. There is no  rebound and no guarding.  Right lower quadrant very tender to palpation, guarding at McBurney's point, remaining abdomen is moderately tender to palpation  Musculoskeletal: Normal range of motion. He exhibits no edema and no tenderness.  Neurological: He is alert and oriented to person, place, and time.  Skin: Skin is warm and dry.  Psychiatric: He has a normal mood and affect. His behavior is normal. Judgment and thought content normal.    ED Course  Procedures (including critical care time) Labs Review Labs Reviewed  CBC WITH DIFFERENTIAL  COMPREHENSIVE METABOLIC PANEL  LIPASE, BLOOD  URINALYSIS, ROUTINE W REFLEX MICROSCOPIC    Imaging Review No results found.   EKG Interpretation None      MDM   Final diagnoses:  Abdominal pain, unspecified abdominal location    Patient with right lower quadrant abdominal pain. Symptoms started yesterday. He has a history of chronic diarrhea.  CT is negative. Labs are reassuring.  11:36 PM Patient states that he feels a little better. His pain is significantly improved. No acute process tonight. Abdominal return precautions given. Recommend followup gastric patient is stable and ready for discharge.  Patient instructed to return for:  New or worsening symptoms, including, increased abdominal pain, especially pain that localizes to one side, bloody vomit, bloody diarrhea, fever >101, and intractable vomiting.     Roxy Horsemanobert Jozette Castrellon, PA-C 12/26/13 2337

## 2013-12-26 NOTE — ED Notes (Signed)
Pt states he has chronic diarrhea but has been also having n/v since yesterday with severe RLQ ABD pain.

## 2013-12-27 NOTE — ED Notes (Signed)
Patient is alert and oriented x3.  He was given DC instructions and follow up visit instructions.  Patient gave verbal understanding.  He was DC ambulatory under his own power to home.  V/S stable.  He was not showing any signs of distress on DC 

## 2015-02-04 ENCOUNTER — Other Ambulatory Visit: Payer: Self-pay | Admitting: Family Medicine

## 2015-02-04 DIAGNOSIS — R937 Abnormal findings on diagnostic imaging of other parts of musculoskeletal system: Secondary | ICD-10-CM

## 2015-02-04 DIAGNOSIS — M546 Pain in thoracic spine: Secondary | ICD-10-CM

## 2015-02-04 DIAGNOSIS — S24111A Complete lesion at T1 level of thoracic spinal cord, initial encounter: Secondary | ICD-10-CM

## 2015-02-05 ENCOUNTER — Ambulatory Visit
Admission: RE | Admit: 2015-02-05 | Discharge: 2015-02-05 | Disposition: A | Discharge status: Home / Self Care | Payer: BLUE CROSS/BLUE SHIELD | Source: Ambulatory Visit | Attending: Family Medicine | Admitting: Family Medicine

## 2015-02-05 DIAGNOSIS — R937 Abnormal findings on diagnostic imaging of other parts of musculoskeletal system: Secondary | ICD-10-CM

## 2015-02-05 DIAGNOSIS — S24111A Complete lesion at T1 level of thoracic spinal cord, initial encounter: Secondary | ICD-10-CM

## 2015-02-05 DIAGNOSIS — M546 Pain in thoracic spine: Secondary | ICD-10-CM

## 2015-02-05 MED ORDER — GADOBENATE DIMEGLUMINE 529 MG/ML IV SOLN
20.0000 mL | Freq: Once | INTRAVENOUS | Status: AC | PRN
  Administered 2015-02-05: 20 mL via INTRAVENOUS

## 2015-02-11 ENCOUNTER — Other Ambulatory Visit: Payer: Self-pay | Admitting: Neurosurgery

## 2015-02-13 ENCOUNTER — Other Ambulatory Visit: Payer: Self-pay | Admitting: Neurosurgery

## 2015-02-18 ENCOUNTER — Encounter (HOSPITAL_COMMUNITY): Payer: Self-pay

## 2015-02-18 ENCOUNTER — Encounter (HOSPITAL_COMMUNITY)
Admission: RE | Admit: 2015-02-18 | Discharge: 2015-02-18 | Disposition: A | Discharge status: Home / Self Care | Payer: BLUE CROSS/BLUE SHIELD | Source: Ambulatory Visit | Attending: Neurosurgery | Admitting: Neurosurgery

## 2015-02-18 DIAGNOSIS — Z791 Long term (current) use of non-steroidal anti-inflammatories (NSAID): Secondary | ICD-10-CM | POA: Diagnosis not present

## 2015-02-18 DIAGNOSIS — E669 Obesity, unspecified: Secondary | ICD-10-CM | POA: Diagnosis not present

## 2015-02-18 DIAGNOSIS — K900 Celiac disease: Secondary | ICD-10-CM | POA: Diagnosis not present

## 2015-02-18 DIAGNOSIS — M5124 Other intervertebral disc displacement, thoracic region: Secondary | ICD-10-CM | POA: Diagnosis present

## 2015-02-18 DIAGNOSIS — M5104 Intervertebral disc disorders with myelopathy, thoracic region: Secondary | ICD-10-CM | POA: Diagnosis not present

## 2015-02-18 DIAGNOSIS — I10 Essential (primary) hypertension: Secondary | ICD-10-CM | POA: Diagnosis not present

## 2015-02-18 DIAGNOSIS — Z79899 Other long term (current) drug therapy: Secondary | ICD-10-CM | POA: Diagnosis not present

## 2015-02-18 DIAGNOSIS — Z6838 Body mass index (BMI) 38.0-38.9, adult: Secondary | ICD-10-CM | POA: Diagnosis not present

## 2015-02-18 HISTORY — DX: Nausea with vomiting, unspecified: R11.2

## 2015-02-18 HISTORY — DX: Other specified postprocedural states: Z98.890

## 2015-02-18 HISTORY — DX: Personal history of other diseases of the respiratory system: Z87.09

## 2015-02-18 HISTORY — DX: Anxiety disorder, unspecified: F41.9

## 2015-02-18 HISTORY — DX: Essential (primary) hypertension: I10

## 2015-02-18 HISTORY — DX: Anemia, unspecified: D64.9

## 2015-02-18 HISTORY — DX: Other bacterial infections of unspecified site: A49.8

## 2015-02-18 HISTORY — DX: Personal history of urinary calculi: Z87.442

## 2015-02-18 LAB — CBC
HEMATOCRIT: 44.2 % (ref 39.0–52.0)
Hemoglobin: 16 g/dL (ref 13.0–17.0)
MCH: 30.9 pg (ref 26.0–34.0)
MCHC: 36.2 g/dL — AB (ref 30.0–36.0)
MCV: 85.5 fL (ref 78.0–100.0)
Platelets: 201 10*3/uL (ref 150–400)
RBC: 5.17 MIL/uL (ref 4.22–5.81)
RDW: 12.4 % (ref 11.5–15.5)
WBC: 9.4 10*3/uL (ref 4.0–10.5)

## 2015-02-18 LAB — SURGICAL PCR SCREEN
MRSA, PCR: NEGATIVE
Staphylococcus aureus: POSITIVE — AB

## 2015-02-18 LAB — BASIC METABOLIC PANEL
ANION GAP: 10 (ref 5–15)
BUN: 14 mg/dL (ref 6–20)
CALCIUM: 9.5 mg/dL (ref 8.9–10.3)
CO2: 24 mmol/L (ref 22–32)
Chloride: 105 mmol/L (ref 101–111)
Creatinine, Ser: 1.12 mg/dL (ref 0.61–1.24)
GFR calc Af Amer: >60 mL/min (ref >60)
GLUCOSE: 95 mg/dL (ref 65–99)
POTASSIUM: 4 mmol/L (ref 3.5–5.1)
SODIUM: 139 mmol/L (ref 135–145)

## 2015-02-18 NOTE — Progress Notes (Signed)
Patient notified of PCR results.  Prescription called into [facility] in [place], [address].  Patient verbalized understanding.

## 2015-02-18 NOTE — Progress Notes (Signed)
PCP - Dr. Carilyn Goodpasture Cardiologist - Denies  EKG - 02/18/15 - Epic CXR - denies  Echo/Stress Test/Cardiac Cath - denies  Pt. Denies shortness of breath and chest pain at PAT appointment.

## 2015-02-18 NOTE — Pre-Procedure Instructions (Signed)
    Tyler Reynolds  02/18/2015      [id number] [name], [place] - [name] [address] Phone: [phone] Fax: [phone]    Your procedure is scheduled on Friday, September 2nd, 2016  Report to [facility] Admitting at 11:00 A.M.  Call this number if you have problems the morning of surgery:  [phone]   Remember:  Do not eat food or drink liquids after midnight.   Take these medicines the morning of surgery with A SIP OF WATER: Metoprolol Succinate (Toprol-XL).  Stop taking: Ibuprofen, Advil, Motrin, NSAIDS, Aspirin, Aleve, Naproxen, BC's, Goody's, Fish Oil, all herbal medications, and all vitamins.    Do not wear jewelry.  Do not wear lotions, powders, or cologne.  You may NOT wear deodorant.  Men may shave face and neck.  Do not bring valuables to the hospital.  [facility] is not responsible for any belongings or valuables.  Contacts, dentures or bridgework may not be worn into surgery.  Leave your suitcase in the car.  After surgery it may be brought to your room.  For patients admitted to the hospital, discharge time will be determined by your treatment team.  Patients discharged the day of surgery will not be allowed to drive home.   Special instructions:  See attached.   Please read over the following fact sheets that you were given. Pain Booklet, Coughing and Deep Breathing, MRSA Information and Surgical Site Infection Prevention

## 2015-02-19 NOTE — Progress Notes (Signed)
Anesthesia Chart Review: Patient is a 34 year old male scheduled for right T6 diskectomy possible costotransversectomy on 02/21/15 by Dr. Conchita Paris.   History includes post-operative N/V, non-smoker, IBS, celiac disease, nephrolithiasis, HTN, anemia, anxiety, c-difficile '14, cholecystectomy, tonsillectomy. BMI is consistent with obesity. PCP is Dr. Carilyn Goodpasture.   Meds: ibuprofen, Toprol XL.  02/18/15 EKG: NSR, incomplete right BBB. No significant change since last tracing 02/06/11. Denied CP and SOB at PAT. No previous cardiac testing other than EKGs.  Preoperative labs noted.   If no acute changes then I anticipate that he can proceed as planned.  Velna Ochs [facility]/Anesthesiology Phone [phone] 02/19/2015 10:36 AM

## 2015-02-20 MED ORDER — VANCOMYCIN HCL 10 G IV SOLR
1500.0000 mg | INTRAVENOUS | Status: AC
  Administered 2015-02-21: 1500 mg via INTRAVENOUS
  Filled 2015-02-20: qty 1500

## 2015-02-20 MED ORDER — SCOPOLAMINE 1 MG/3DAYS TD PT72
1.0000 | MEDICATED_PATCH | TRANSDERMAL | Status: DC
  Administered 2015-02-21: 1.5 mg via TRANSDERMAL
  Filled 2015-02-20: qty 1

## 2015-02-20 NOTE — Anesthesia Preprocedure Evaluation (Addendum)
Anesthesia Evaluation  Patient identified by MRN, date of birth, ID band Patient awake    Reviewed: Allergy & Precautions, NPO status , Patient's Chart, lab work & pertinent test results  History of Anesthesia Complications (+) PONV  Airway Mallampati: II   Neck ROM: Full    Dental  (+) Teeth Intact, Dental Advisory Given   Pulmonary neg pulmonary ROS,  breath sounds clear to auscultation        Cardiovascular hypertension, Pt. on medications Rhythm:Regular  EKG 01/2015 RBB incomplete   Neuro/Psych Anxiety negative neurological ROS     GI/Hepatic negative GI ROS, Neg liver ROS,   Endo/Other  negative endocrine ROS  Renal/GU stones     Musculoskeletal   Abdominal (+) + obese,   Peds  Hematology 16/44   Anesthesia Other Findings   Reproductive/Obstetrics                            Anesthesia Physical Anesthesia Plan  ASA: III  Anesthesia Plan: General   Post-op Pain Management:    Induction: Intravenous  Airway Management Planned: Oral ETT and Video Laryngoscope Planned  Additional Equipment:   Intra-op Plan:   Post-operative Plan: Extubation in OR  Informed Consent: I have reviewed the patients History and Physical, chart, labs and discussed the procedure including the risks, benefits and alternatives for the proposed anesthesia with the patient or authorized representative who has indicated his/her understanding and acceptance.     Plan Discussed with:   Anesthesia Plan Comments:         Anesthesia Quick Evaluation

## 2015-02-21 ENCOUNTER — Encounter (HOSPITAL_COMMUNITY)
Admission: RE | Disposition: A | Discharge status: Home / Self Care | Payer: Self-pay | Source: Ambulatory Visit | Attending: Neurosurgery

## 2015-02-21 ENCOUNTER — Ambulatory Visit (HOSPITAL_COMMUNITY): Payer: BLUE CROSS/BLUE SHIELD | Admitting: Anesthesiology

## 2015-02-21 ENCOUNTER — Ambulatory Visit (HOSPITAL_COMMUNITY): Payer: BLUE CROSS/BLUE SHIELD | Admitting: Vascular Surgery

## 2015-02-21 ENCOUNTER — Encounter (HOSPITAL_COMMUNITY): Payer: Self-pay | Admitting: *Deleted

## 2015-02-21 ENCOUNTER — Ambulatory Visit (HOSPITAL_COMMUNITY): Payer: BLUE CROSS/BLUE SHIELD

## 2015-02-21 ENCOUNTER — Observation Stay (HOSPITAL_COMMUNITY)
Admission: RE | Admit: 2015-02-21 | Discharge: 2015-02-22 | Disposition: A | Discharge status: Home / Self Care | Payer: BLUE CROSS/BLUE SHIELD | Source: Ambulatory Visit | Attending: Neurosurgery | Admitting: Neurosurgery

## 2015-02-21 DIAGNOSIS — Z791 Long term (current) use of non-steroidal anti-inflammatories (NSAID): Secondary | ICD-10-CM | POA: Insufficient documentation

## 2015-02-21 DIAGNOSIS — Z419 Encounter for procedure for purposes other than remedying health state, unspecified: Secondary | ICD-10-CM

## 2015-02-21 DIAGNOSIS — E669 Obesity, unspecified: Secondary | ICD-10-CM | POA: Insufficient documentation

## 2015-02-21 DIAGNOSIS — K900 Celiac disease: Secondary | ICD-10-CM | POA: Insufficient documentation

## 2015-02-21 DIAGNOSIS — M5124 Other intervertebral disc displacement, thoracic region: Secondary | ICD-10-CM | POA: Diagnosis present

## 2015-02-21 DIAGNOSIS — Z79899 Other long term (current) drug therapy: Secondary | ICD-10-CM | POA: Insufficient documentation

## 2015-02-21 DIAGNOSIS — M5104 Intervertebral disc disorders with myelopathy, thoracic region: Secondary | ICD-10-CM | POA: Diagnosis not present

## 2015-02-21 DIAGNOSIS — Z6838 Body mass index (BMI) 38.0-38.9, adult: Secondary | ICD-10-CM | POA: Insufficient documentation

## 2015-02-21 DIAGNOSIS — I10 Essential (primary) hypertension: Secondary | ICD-10-CM | POA: Insufficient documentation

## 2015-02-21 HISTORY — PX: THORACIC DISCECTOMY: SHX6113

## 2015-02-21 LAB — CBC
HEMATOCRIT: 41.5 % (ref 39.0–52.0)
HEMOGLOBIN: 14.8 g/dL (ref 13.0–17.0)
MCH: 30.5 pg (ref 26.0–34.0)
MCHC: 35.7 g/dL (ref 30.0–36.0)
MCV: 85.4 fL (ref 78.0–100.0)
Platelets: 170 10*3/uL (ref 150–400)
RBC: 4.86 MIL/uL (ref 4.22–5.81)
RDW: 12.3 % (ref 11.5–15.5)
WBC: 15.6 10*3/uL — ABNORMAL HIGH (ref 4.0–10.5)

## 2015-02-21 LAB — CREATININE, SERUM
Creatinine, Ser: 1.17 mg/dL (ref 0.61–1.24)
GFR calc Af Amer: >60 mL/min (ref >60)

## 2015-02-21 SURGERY — THORACIC DISCECTOMY
Anesthesia: General | Site: Spine Thoracic | Laterality: Right

## 2015-02-21 MED ORDER — MIDAZOLAM HCL 5 MG/5ML IJ SOLN
INTRAMUSCULAR | Status: DC | PRN
  Administered 2015-02-21: 2 mg via INTRAVENOUS

## 2015-02-21 MED ORDER — MENTHOL 3 MG MT LOZG: 1.0000 | LOZENGE | OROMUCOSAL | Status: DC | PRN

## 2015-02-21 MED ORDER — IBUPROFEN 200 MG PO TABS
800.0000 mg | ORAL_TABLET | Freq: Two times a day (BID) | ORAL | Status: DC
  Administered 2015-02-21 – 2015-02-22 (×2): 800 mg via ORAL
  Filled 2015-02-21 (×2): qty 4

## 2015-02-21 MED ORDER — LIDOCAINE-EPINEPHRINE 1 %-1:100000 IJ SOLN
INTRAMUSCULAR | Status: DC | PRN
  Administered 2015-02-21: 7 mL

## 2015-02-21 MED ORDER — HYDROMORPHONE HCL 1 MG/ML IJ SOLN
0.2500 mg | INTRAMUSCULAR | Status: DC | PRN
  Administered 2015-02-21 (×2): 0.5 mg via INTRAVENOUS

## 2015-02-21 MED ORDER — ONDANSETRON HCL 4 MG/2ML IJ SOLN
INTRAMUSCULAR | Status: DC | PRN
  Administered 2015-02-21: 4 mg via INTRAVENOUS

## 2015-02-21 MED ORDER — PHENOL 1.4 % MT LIQD: 1.0000 | OROMUCOSAL | Status: DC | PRN

## 2015-02-21 MED ORDER — FENTANYL CITRATE (PF) 250 MCG/5ML IJ SOLN
INTRAMUSCULAR | Status: AC
  Filled 2015-02-21: qty 5

## 2015-02-21 MED ORDER — ACETAMINOPHEN 650 MG RE SUPP: 650.0000 mg | RECTAL | Status: DC | PRN

## 2015-02-21 MED ORDER — DEXAMETHASONE SODIUM PHOSPHATE 10 MG/ML IJ SOLN
INTRAMUSCULAR | Status: DC | PRN
  Administered 2015-02-21: 10 mg via INTRAVENOUS

## 2015-02-21 MED ORDER — MIDAZOLAM HCL 2 MG/2ML IJ SOLN
INTRAMUSCULAR | Status: AC
  Filled 2015-02-21: qty 4

## 2015-02-21 MED ORDER — SUGAMMADEX SODIUM 200 MG/2ML IV SOLN
INTRAVENOUS | Status: AC
  Filled 2015-02-21: qty 2

## 2015-02-21 MED ORDER — THROMBIN 5000 UNITS EX SOLR
OROMUCOSAL | Status: DC | PRN
  Administered 2015-02-21: 15:00:00 via TOPICAL

## 2015-02-21 MED ORDER — ROCURONIUM BROMIDE 100 MG/10ML IV SOLN
INTRAVENOUS | Status: DC | PRN
  Administered 2015-02-21 (×3): 10 mg via INTRAVENOUS
  Administered 2015-02-21: 50 mg via INTRAVENOUS
  Administered 2015-02-21 (×2): 10 mg via INTRAVENOUS

## 2015-02-21 MED ORDER — LIDOCAINE HCL (CARDIAC) 20 MG/ML IV SOLN
INTRAVENOUS | Status: DC | PRN
  Administered 2015-02-21: 100 mg via INTRAVENOUS

## 2015-02-21 MED ORDER — HEPARIN SODIUM (PORCINE) 5000 UNIT/ML IJ SOLN
5000.0000 [IU] | Freq: Three times a day (TID) | INTRAMUSCULAR | Status: DC
  Administered 2015-02-22: 5000 [IU] via SUBCUTANEOUS
  Filled 2015-02-21: qty 1

## 2015-02-21 MED ORDER — VANCOMYCIN HCL 10 G IV SOLR
1500.0000 mg | Freq: Once | INTRAVENOUS | Status: AC
  Administered 2015-02-22: 1500 mg via INTRAVENOUS
  Filled 2015-02-21: qty 1500

## 2015-02-21 MED ORDER — DIAZEPAM 5 MG PO TABS: 5.0000 mg | ORAL_TABLET | Freq: Four times a day (QID) | ORAL | Status: DC | PRN

## 2015-02-21 MED ORDER — ONDANSETRON HCL 4 MG/2ML IJ SOLN
INTRAMUSCULAR | Status: AC
  Filled 2015-02-21: qty 2

## 2015-02-21 MED ORDER — PANTOPRAZOLE SODIUM 40 MG IV SOLR
40.0000 mg | Freq: Every day | INTRAVENOUS | Status: DC
  Administered 2015-02-21: 40 mg via INTRAVENOUS
  Filled 2015-02-21: qty 40

## 2015-02-21 MED ORDER — ACETAMINOPHEN 325 MG PO TABS: 650.0000 mg | ORAL_TABLET | ORAL | Status: DC | PRN

## 2015-02-21 MED ORDER — SUGAMMADEX SODIUM 200 MG/2ML IV SOLN
INTRAVENOUS | Status: DC | PRN
  Administered 2015-02-21: 216 mg via INTRAVENOUS

## 2015-02-21 MED ORDER — DOCUSATE SODIUM 100 MG PO CAPS
100.0000 mg | ORAL_CAPSULE | Freq: Two times a day (BID) | ORAL | Status: DC
  Administered 2015-02-22: 100 mg via ORAL
  Filled 2015-02-21 (×2): qty 1

## 2015-02-21 MED ORDER — LACTATED RINGERS IV SOLN
INTRAVENOUS | Status: DC
  Administered 2015-02-21 (×2): via INTRAVENOUS

## 2015-02-21 MED ORDER — BUPIVACAINE HCL (PF) 0.5 % IJ SOLN
INTRAMUSCULAR | Status: DC | PRN
  Administered 2015-02-21: 7 mL

## 2015-02-21 MED ORDER — ROCURONIUM BROMIDE 50 MG/5ML IV SOLN
INTRAVENOUS | Status: AC
  Filled 2015-02-21: qty 1

## 2015-02-21 MED ORDER — SODIUM CHLORIDE 0.9 % IJ SOLN
3.0000 mL | Freq: Two times a day (BID) | INTRAMUSCULAR | Status: DC
  Administered 2015-02-21: 3 mL via INTRAVENOUS

## 2015-02-21 MED ORDER — MORPHINE SULFATE (PF) 2 MG/ML IV SOLN
1.0000 mg | INTRAVENOUS | Status: DC | PRN
  Administered 2015-02-21: 4 mg via INTRAVENOUS
  Administered 2015-02-21: 2 mg via INTRAVENOUS
  Administered 2015-02-22: 4 mg via INTRAVENOUS
  Filled 2015-02-21: qty 2
  Filled 2015-02-21: qty 1
  Filled 2015-02-21: qty 2

## 2015-02-21 MED ORDER — SODIUM CHLORIDE 0.9 % IJ SOLN: 3.0000 mL | INTRAMUSCULAR | Status: DC | PRN

## 2015-02-21 MED ORDER — PHENYLEPHRINE HCL 10 MG/ML IJ SOLN
INTRAMUSCULAR | Status: DC | PRN
  Administered 2015-02-21 (×2): 40 ug via INTRAVENOUS

## 2015-02-21 MED ORDER — SODIUM CHLORIDE 0.9 % IV SOLN: 250.0000 mL | INTRAVENOUS | Status: DC

## 2015-02-21 MED ORDER — PROPOFOL 10 MG/ML IV BOLUS
INTRAVENOUS | Status: DC | PRN
  Administered 2015-02-21: 200 mg via INTRAVENOUS

## 2015-02-21 MED ORDER — THROMBIN 20000 UNITS EX SOLR
CUTANEOUS | Status: DC | PRN
  Administered 2015-02-21: 15:00:00 via TOPICAL

## 2015-02-21 MED ORDER — ONDANSETRON HCL 4 MG/2ML IJ SOLN: 4.0000 mg | INTRAMUSCULAR | Status: DC | PRN

## 2015-02-21 MED ORDER — METOPROLOL SUCCINATE ER 25 MG PO TB24
25.0000 mg | ORAL_TABLET | Freq: Every day | ORAL | Status: DC
  Administered 2015-02-21: 25 mg via ORAL
  Filled 2015-02-21: qty 1

## 2015-02-21 MED ORDER — MEPERIDINE HCL 25 MG/ML IJ SOLN: 6.2500 mg | INTRAMUSCULAR | Status: DC | PRN

## 2015-02-21 MED ORDER — SENNA 8.6 MG PO TABS
1.0000 | ORAL_TABLET | Freq: Two times a day (BID) | ORAL | Status: DC
  Administered 2015-02-22: 8.6 mg via ORAL
  Filled 2015-02-21 (×2): qty 1

## 2015-02-21 MED ORDER — BISACODYL 10 MG RE SUPP: 10.0000 mg | Freq: Every day | RECTAL | Status: DC | PRN

## 2015-02-21 MED ORDER — HYDROMORPHONE HCL 1 MG/ML IJ SOLN
INTRAMUSCULAR | Status: AC
  Filled 2015-02-21: qty 1

## 2015-02-21 MED ORDER — FENTANYL CITRATE (PF) 100 MCG/2ML IJ SOLN
INTRAMUSCULAR | Status: DC | PRN
  Administered 2015-02-21: 50 ug via INTRAVENOUS
  Administered 2015-02-21 (×2): 100 ug via INTRAVENOUS

## 2015-02-21 MED ORDER — SODIUM CHLORIDE 0.9 % IV SOLN
INTRAVENOUS | Status: DC
  Administered 2015-02-21: 1000 mL via INTRAVENOUS

## 2015-02-21 MED ORDER — PROMETHAZINE HCL 25 MG/ML IJ SOLN: 6.2500 mg | INTRAMUSCULAR | Status: DC | PRN

## 2015-02-21 MED ORDER — SUCCINYLCHOLINE CHLORIDE 20 MG/ML IJ SOLN
INTRAMUSCULAR | Status: AC
  Filled 2015-02-21: qty 1

## 2015-02-21 MED ORDER — PROPOFOL 10 MG/ML IV BOLUS
INTRAVENOUS | Status: AC
  Filled 2015-02-21: qty 20

## 2015-02-21 MED ORDER — OXYCODONE-ACETAMINOPHEN 5-325 MG PO TABS
1.0000 | ORAL_TABLET | ORAL | Status: DC | PRN
  Administered 2015-02-22 (×2): 2 via ORAL
  Filled 2015-02-21 (×2): qty 2

## 2015-02-21 MED ORDER — LIDOCAINE HCL (CARDIAC) 20 MG/ML IV SOLN
INTRAVENOUS | Status: AC
  Filled 2015-02-21: qty 5

## 2015-02-21 MED ORDER — SODIUM CHLORIDE 0.9 % IR SOLN
Status: DC | PRN
  Administered 2015-02-21: 15:00:00

## 2015-02-21 MED ORDER — 0.9 % SODIUM CHLORIDE (POUR BTL) OPTIME
TOPICAL | Status: DC | PRN
  Administered 2015-02-21: 1000 mL

## 2015-02-21 SURGICAL SUPPLY — 56 items
APL SKNCLS STERI-STRIP NONHPOA (GAUZE/BANDAGES/DRESSINGS)
BAG DECANTER FOR FLEXI CONT (MISCELLANEOUS) ×2 IMPLANT
BENZOIN TINCTURE PRP APPL 2/3 (GAUZE/BANDAGES/DRESSINGS) IMPLANT
BLADE CLIPPER SURG (BLADE) ×1 IMPLANT
BLADE SURG 11 STRL SS (BLADE) ×2 IMPLANT
BUR MATCHSTICK NEURO 3.0 LAGG (BURR) ×2 IMPLANT
CANISTER SUCT 3000ML PPV (MISCELLANEOUS) ×2 IMPLANT
DECANTER SPIKE VIAL GLASS SM (MISCELLANEOUS) ×2 IMPLANT
DRAPE LAPAROTOMY 100X72X124 (DRAPES) ×2 IMPLANT
DRAPE MICROSCOPE LEICA (MISCELLANEOUS) ×2 IMPLANT
DRAPE POUCH INSTRU U-SHP 10X18 (DRAPES) ×2 IMPLANT
DRAPE SURG 17X23 STRL (DRAPES) ×2 IMPLANT
DURAPREP 26ML APPLICATOR (WOUND CARE) ×2 IMPLANT
ELECT REM PT RETURN 9FT ADLT (ELECTROSURGICAL) ×2
ELECTRODE REM PT RTRN 9FT ADLT (ELECTROSURGICAL) ×1 IMPLANT
GAUZE SPONGE 4X4 12PLY STRL (GAUZE/BANDAGES/DRESSINGS) IMPLANT
GAUZE SPONGE 4X4 16PLY XRAY LF (GAUZE/BANDAGES/DRESSINGS) ×1 IMPLANT
GLOVE BIOGEL PI IND STRL 7.5 (GLOVE) ×1 IMPLANT
GLOVE BIOGEL PI INDICATOR 7.5 (GLOVE) ×1
GLOVE ECLIPSE 7.0 STRL STRAW (GLOVE) ×2 IMPLANT
GLOVE EXAM NITRILE LRG STRL (GLOVE) IMPLANT
GLOVE EXAM NITRILE MD LF STRL (GLOVE) IMPLANT
GLOVE EXAM NITRILE XL STR (GLOVE) IMPLANT
GLOVE EXAM NITRILE XS STR PU (GLOVE) IMPLANT
GOWN STRL REUS W/ TWL LRG LVL3 (GOWN DISPOSABLE) ×2 IMPLANT
GOWN STRL REUS W/ TWL XL LVL3 (GOWN DISPOSABLE) IMPLANT
GOWN STRL REUS W/TWL 2XL LVL3 (GOWN DISPOSABLE) IMPLANT
GOWN STRL REUS W/TWL LRG LVL3 (GOWN DISPOSABLE) ×4
GOWN STRL REUS W/TWL XL LVL3 (GOWN DISPOSABLE)
HEMOSTAT POWDER KIT SURGIFOAM (HEMOSTASIS) ×2 IMPLANT
KIT BASIN OR (CUSTOM PROCEDURE TRAY) ×2 IMPLANT
KIT ROOM TURNOVER OR (KITS) ×2 IMPLANT
LIQUID BAND (GAUZE/BANDAGES/DRESSINGS) ×1 IMPLANT
NDL HYPO 18GX1.5 BLUNT FILL (NEEDLE) IMPLANT
NDL HYPO 25X1 1.5 SAFETY (NEEDLE) ×1 IMPLANT
NDL SPNL 18GX3.5 QUINCKE PK (NEEDLE) IMPLANT
NEEDLE HYPO 18GX1.5 BLUNT FILL (NEEDLE) IMPLANT
NEEDLE HYPO 25X1 1.5 SAFETY (NEEDLE) ×2 IMPLANT
NEEDLE SPNL 18GX3.5 QUINCKE PK (NEEDLE) ×6 IMPLANT
NS IRRIG 1000ML POUR BTL (IV SOLUTION) ×2 IMPLANT
PACK LAMINECTOMY NEURO (CUSTOM PROCEDURE TRAY) ×2 IMPLANT
PAD ARMBOARD 7.5X6 YLW CONV (MISCELLANEOUS) ×6 IMPLANT
RUBBERBAND STERILE (MISCELLANEOUS) ×4 IMPLANT
SPONGE LAP 4X18 X RAY DECT (DISPOSABLE) IMPLANT
SPONGE SURGIFOAM ABS GEL 100 (HEMOSTASIS) ×2 IMPLANT
STRIP CLOSURE SKIN 1/2X4 (GAUZE/BANDAGES/DRESSINGS) IMPLANT
SUT VIC AB 0 CT1 18XCR BRD8 (SUTURE) ×1 IMPLANT
SUT VIC AB 0 CT1 8-18 (SUTURE) ×2
SUT VIC AB 2-0 CT1 18 (SUTURE) IMPLANT
SUT VIC AB 3-0 FS2 27 (SUTURE) IMPLANT
SUT VIC AB 3-0 SH 8-18 (SUTURE) ×2 IMPLANT
SUT VICRYL 3-0 RB1 18 ABS (SUTURE) ×2 IMPLANT
SYR 3ML LL SCALE MARK (SYRINGE) IMPLANT
TOWEL OR 17X24 6PK STRL BLUE (TOWEL DISPOSABLE) ×2 IMPLANT
TOWEL OR 17X26 10 PK STRL BLUE (TOWEL DISPOSABLE) ×2 IMPLANT
WATER STERILE IRR 1000ML POUR (IV SOLUTION) ×2 IMPLANT

## 2015-02-21 NOTE — Transfer of Care (Signed)
Immediate Anesthesia Transfer of Care Note  Patient: Tyler Reynolds  Procedure(s) Performed: Procedure(s) with comments: Right Thoracic Six transpedicular diskectomy  (Right) - Rt. T6 transpedicular diskectomy possible costotransversectomy  Patient Location: PACU  Anesthesia Type:General  Level of Consciousness: awake, alert  and oriented  Airway & Oxygen Therapy: Patient Spontanous Breathing and Patient connected to nasal cannula oxygen  Post-op Assessment: Report given to RN, Post -op Vital signs reviewed and stable and Patient moving all extremities  Post vital signs: Reviewed and stable  Last Vitals:  Filed Vitals:   02/21/15 1730  BP: 128/68  Pulse: 77  Temp: 36.9 C  Resp: 23    Complications: No apparent anesthesia complications
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
## 2015-02-21 NOTE — Anesthesia Procedure Notes (Signed)
Procedure Name: Intubation Date/Time: 02/21/2015 2:12 PM Performed by: Orvilla Fus A Pre-anesthesia Checklist: Patient identified, Timeout performed, Emergency Drugs available, Suction available and Patient being monitored Patient Re-evaluated:Patient Re-evaluated prior to inductionOxygen Delivery Method: Circle system utilized Preoxygenation: Pre-oxygenation with 100% oxygen Intubation Type: IV induction Ventilation: Mask ventilation without difficulty Grade View: Grade I Tube type: Oral Tube size: 7.5 mm Number of attempts: 1 Airway Equipment and Method: Video-laryngoscopy and Rigid stylet Placement Confirmation: ETT inserted through vocal cords under direct vision,  breath sounds checked- equal and bilateral and positive ETCO2 Secured at: 23 cm Tube secured with: Tape Dental Injury: Teeth and Oropharynx as per pre-operative assessment

## 2015-02-21 NOTE — Op Note (Signed)
PREOP DIAGNOSIS:  1. T6-7 disc herniation   POSTOP DIAGNOSIS: Same  PROCEDURE: 1. Right T6 laminotomy, transpedicular approach for discectomy 2. Use of intraoperative microscope for microdissection  SURGEON: Dr. Lisbeth Renshaw, MD  ASSISTANT: Dr. Aliene Beams M.D.  ANESTHESIA: General Endotracheal  EBL: 250cc   SPECIMENS: None  DRAINS: None  COMPLICATIONS: None immediate  CONDITION: Hemodynamically stable to PACU  HISTORY: Tyler Reynolds is a 34 y.o. male initially presented to the outpatient neurosurgery clinic with the complaint of back pain and right-sided leg weakness and numbness. MRI and CT scan were both done demonstrating a calcified lesion, most likely disc material behind the T6 vertebral body on the right side causing spinal cord compression. The patient elected to proceed with surgical decompression. The risks and benefits were plain in detail the patient and his partner. After all questions were answered, informed consent was obtained.  PROCEDURE IN DETAIL: After informed consent was obtained and witnessed, the patient was brought to the operating room. After induction of general anesthesia, the patient was positioned on the operative table in the prone position. All pressure points were meticulously padded. Skin incision was then marked out and prepped and draped in the usual sterile fashion.  After timeout was conducted, AP fluoroscopy was used to identify the 12th rib, and the T6 vertebral body was identified in this manner. Skin incision was then infiltrated with local asthenic, and made sharply. Bovie electrocautery was used to dissect the subcutaneous tissue until the thoracodorsal fascia was identified and incised on the right side. Subperiosteal dissection along the T6 lamina, including the inferior portion of T5 and the superior portion of T7 was completed. Dissector was then placed on the T6 pedicle on the right side, and again, intraoperative AP fluoroscopy  was used to confirm location at the correct level. At this point the microscope was draped sterilely and brought into the field, and the remainder of the case was done under the microscope using microdissection.  A right-sided T6 laminotomy was completed using a high-speed drill. The ligamentum flavum underlying was elevated and removed using rongeurs. The right T6 pars was then divided with the high-speed drill, the inferior articulating process was removed, and the right T6 nerve root was identified and traced out laterally. At this point, the inferior portion of T5 was removed with the high-speed drill, the superior border of the right T6 pedicle was identified with a dissector, and the superior articulating process of T6 was divided. Using a high-speed drill, a complete T6 radiculopathy was completed. At this point, a ball tip dissector was used to confirm a calcific mass located just medial to the pedicle. A combination of the high-speed drill and multiple down pushing rongeurs were used to create a cavity beneath the calcified disc. Once this cavity was created, the epidural space was carefully dissected with a small ball tip dissector, and down pushing rongeurs were used to push the calcified disc fragments into the cavity. Multiple small calcified disc fragments were identified and removed. In this manner, complete decompression of the thecal sac was achieved. Good decompression was confirmed with a series of right angle ball to dissectors.   At this point, hemostasis was achieved using a combination of bipolar electrocautery, bone wax, and morcellized Gelfoam with thrombin. The wound was then irrigated with copious amounts of antibiotic saline. Self-retaining retractor was then removed, and the wound was closed in layers using accommodation oh and 3-0 Vicryl stitches. Skin was closed with Dermabond. Sterile dressing was then  applied. The patient was then transferred to the stretcher, extubated, and  taken to the post anesthesia care unit in stable hemodynamic condition. At the end of the case all sponge, needle, instrument, and cottonoid counts were correct.

## 2015-02-21 NOTE — Anesthesia Postprocedure Evaluation (Signed)
  Anesthesia Post-op Note  Patient: Tyler Reynolds  Procedure(s) Performed: Procedure(s) with comments: Right Thoracic Six transpedicular diskectomy  (Right) - Rt. T6 transpedicular diskectomy possible costotransversectomy  Patient Location: PACU  Anesthesia Type:General  Level of Consciousness: awake  Airway and Oxygen Therapy: Patient Spontanous Breathing  Post-op Pain: mild  Post-op Assessment: Post-op Vital signs reviewed LLE Motor Response: Purposeful movement LLE Sensation: Full sensation RLE Motor Response: Purposeful movement RLE Sensation: Numbness (same as pre-procedure.)      Post-op Vital Signs: Reviewed  Last Vitals:  Filed Vitals:   02/21/15 1831  BP: 147/80  Pulse: 58  Temp: 36.6 C  Resp: 18    Complications: No apparent anesthesia complications

## 2015-02-22 DIAGNOSIS — M5104 Intervertebral disc disorders with myelopathy, thoracic region: Secondary | ICD-10-CM | POA: Diagnosis not present

## 2015-02-22 MED ORDER — DIAZEPAM 5 MG PO TABS: 5.0000 mg | ORAL_TABLET | Freq: Four times a day (QID) | ORAL | Status: AC | PRN

## 2015-02-22 MED ORDER — OXYCODONE-ACETAMINOPHEN 5-325 MG PO TABS: 1.0000 | ORAL_TABLET | ORAL | Status: AC | PRN

## 2015-02-22 MED ORDER — PANTOPRAZOLE SODIUM 40 MG PO TBEC
40.0000 mg | DELAYED_RELEASE_TABLET | Freq: Every day | ORAL | Status: DC
  Filled 2015-02-22: qty 1
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
## 2015-02-22 NOTE — Discharge Summary (Signed)
  Physician Discharge Summary  Patient ID: Tyler Reynolds MRN: [id number] DOB/AGE: 1980/09/02 34 y.o.  Admit date: 02/21/2015 Discharge date: 02/22/2015  Admission Diagnoses:  Discharge Diagnoses:  Active Problems:   HNP (herniated nucleus pulposus), thoracic   Discharged Condition: good  Hospital Course: Surgery yesterday for large disc mass at T 56; did well with marked improvement in pre op symptoms. Ambulated well. Home pod 1, specific instructions given.  Consults: None  Significant Diagnostic Studies: none  Treatments: surgery: T 56 decompression with discectomy  Discharge Exam: Blood pressure 106/45, pulse 69, temperature 98 F (36.7 C), temperature source Oral, resp. rate 18, height  (1.676 m), weight 107.956 kg (238 lb), SpO2 97 %. Incision/Wound:clean and dry; no new neuro issues  Disposition: 01-Home or Self Care     Medication List    ASK your doctor about these medications        ibuprofen 200 MG tablet  Commonly known as:  ADVIL,MOTRIN  Take 800 mg by mouth 2 (two) times daily.     metoprolol succinate 25 MG 24 hr tablet  Commonly known as:  TOPROL-XL  Take 25 mg by mouth at bedtime.         At home rest most of the time. Get up 9 or 10 times each day and take a 15 or 20 minute walk. No riding in the car and to your first postoperative appointment. If you have neck surgery you may shower from the chest down starting on the third postoperative day. If you had back surgery he may start showering on the third postoperative day with saran wrap wrapped around your incisional area 3 times. After the shower remove the saran wrap. Take pain medicine as needed and other medications as instructed. Call my office for an appointment.  SignedReinaldo Meeker, MD 02/22/2015, 9:41 AM

## 2015-02-22 NOTE — Progress Notes (Signed)
Pt discharged home with his partner. Verbalized understanding discharge instructions, medications, and follow-up appointments. Pt denied having any concerns at the time of discharge.

## 2015-02-25 ENCOUNTER — Encounter (HOSPITAL_COMMUNITY): Payer: Self-pay | Admitting: Neurosurgery

## 2015-04-19 IMAGING — CT CT ABD-PELV W/O CM
1 series · 14 of 21 positions shown, 19 images · non-contrast
Comparison: 05/15/2013

CLINICAL DATA: Left flank pain. Nausea and vomiting. Chills. Fever.
History of celiac disease.

EXAM:
CT ABDOMEN AND PELVIS WITHOUT CONTRAST
TECHNIQUE: Multidetector CT imaging of the abdomen and pelvis was performed
following the standard protocol without intravenous contrast.

[Series 3: lung · axial · 0.74mm/px · z∈[+1400,+1490]mm · 14 of 21 slices shown, 19 images]
[im 2/21  soft-tissue]
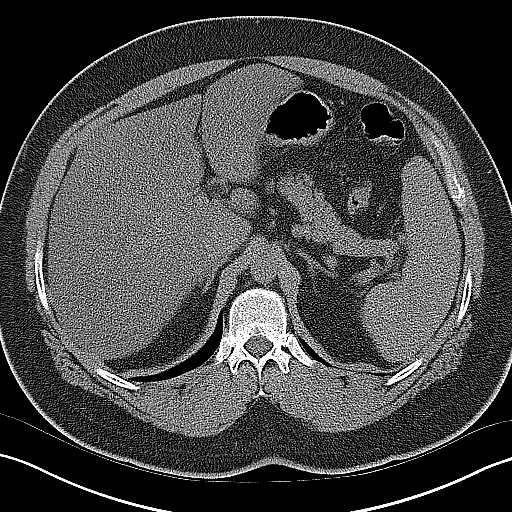
[im 2/21  bone]
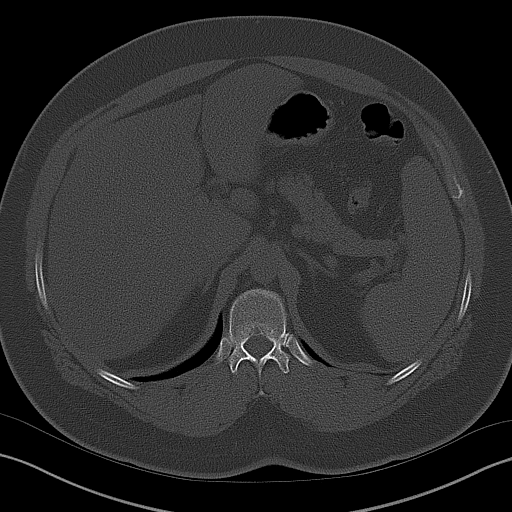
[im 4/21  soft-tissue]
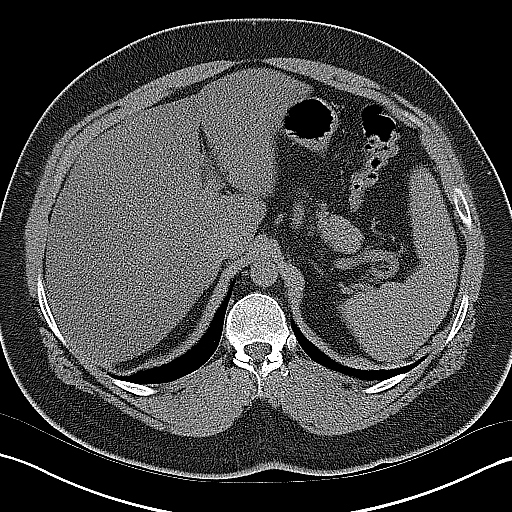
[im 5/21  soft-tissue]
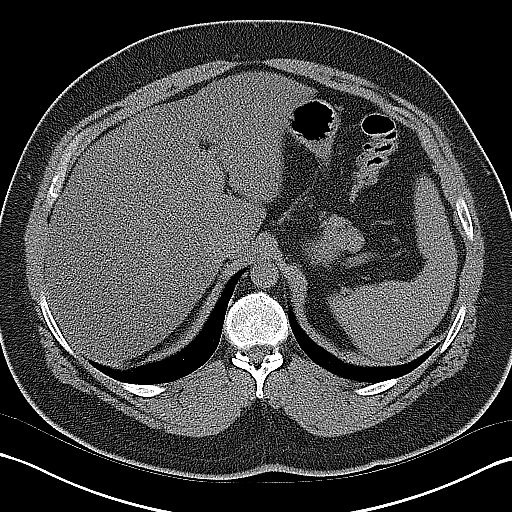
[im 7/21  soft-tissue]
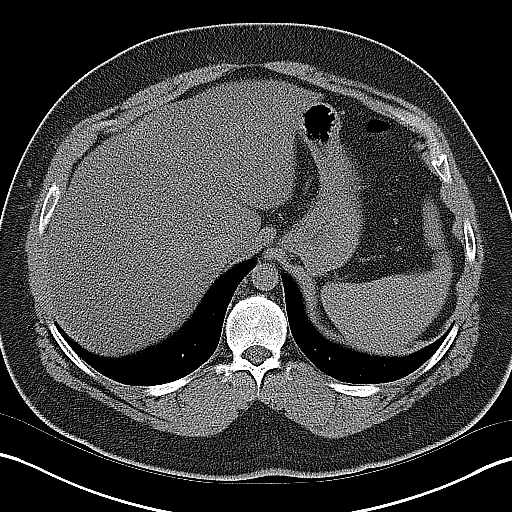
[im 8/21  soft-tissue]
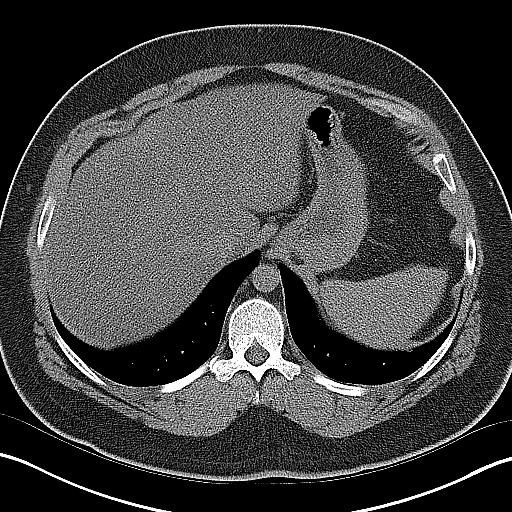
[im 10/21  soft-tissue]
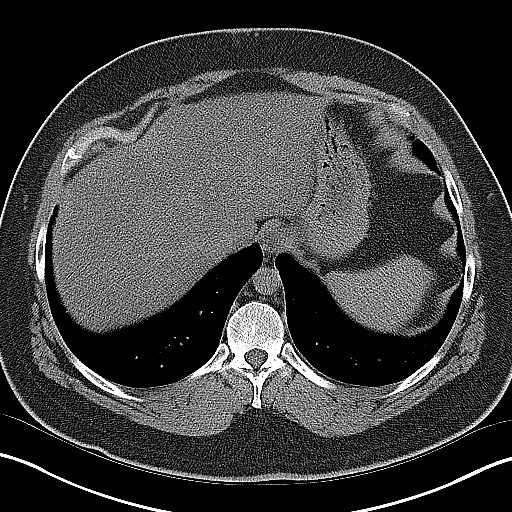
[im 11/21  soft-tissue]
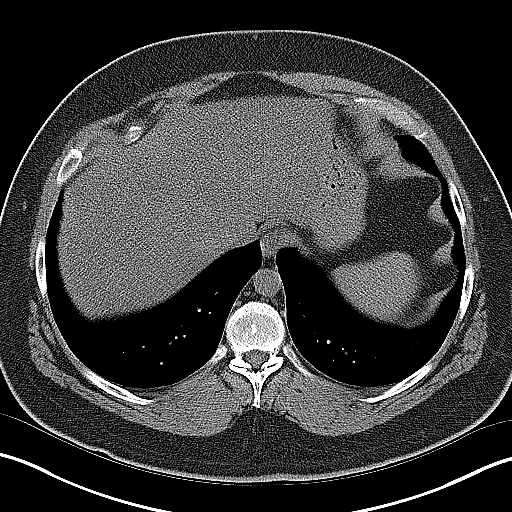
[im 12/21  soft-tissue]
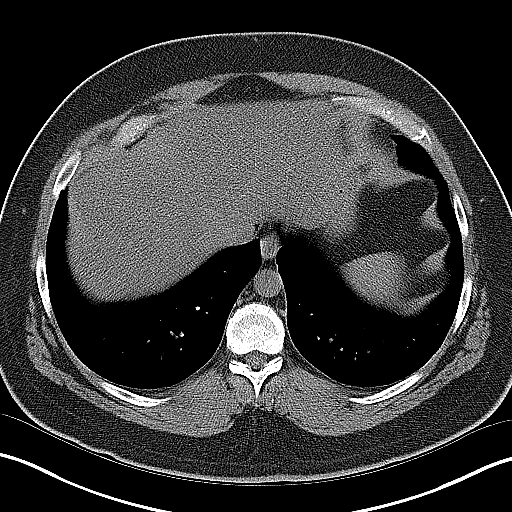
[im 14/21  soft-tissue]
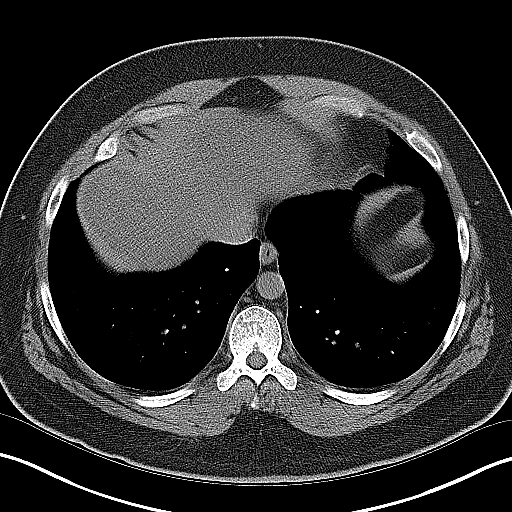
[im 14/21  bone]
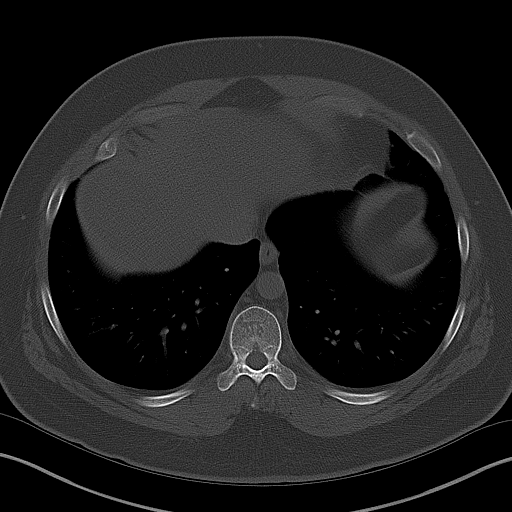
[im 15/21  soft-tissue]
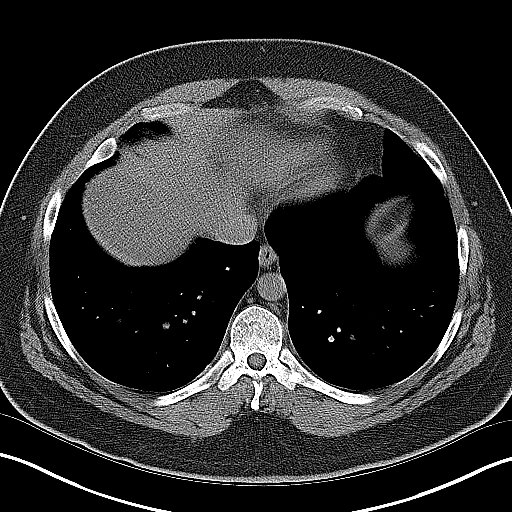
[im 17/21  soft-tissue]
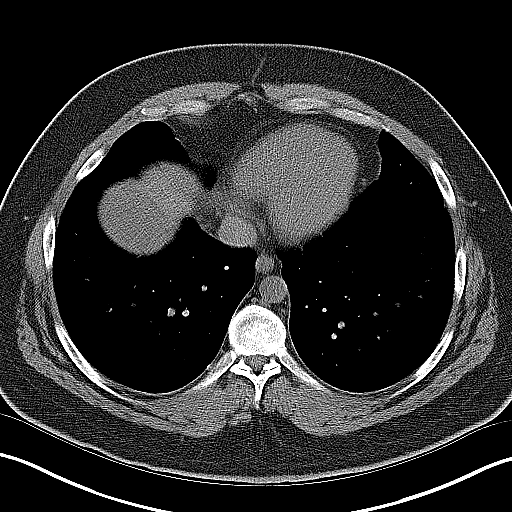
[im 17/21  lung]
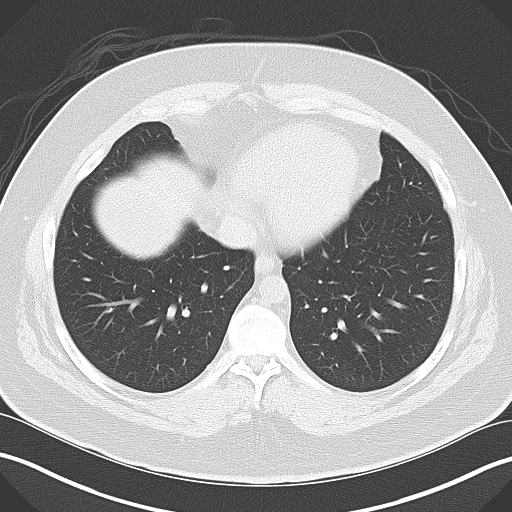
[im 18/21  soft-tissue]
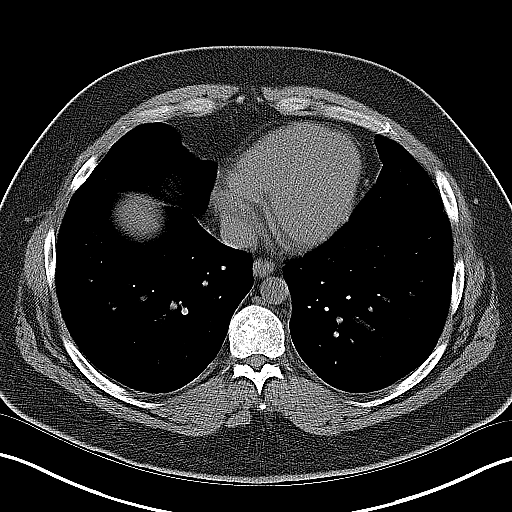
[im 18/21  lung]
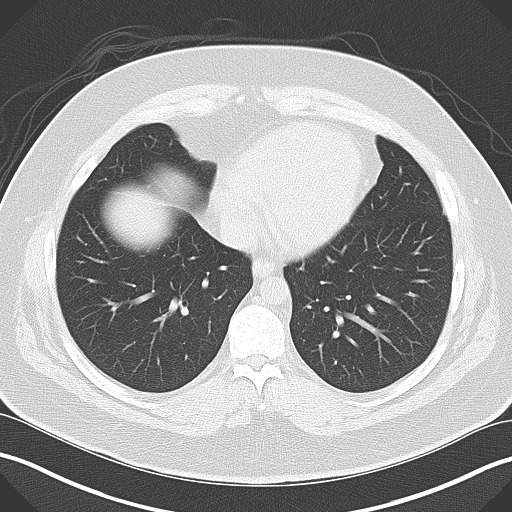
[im 19/21  lung]
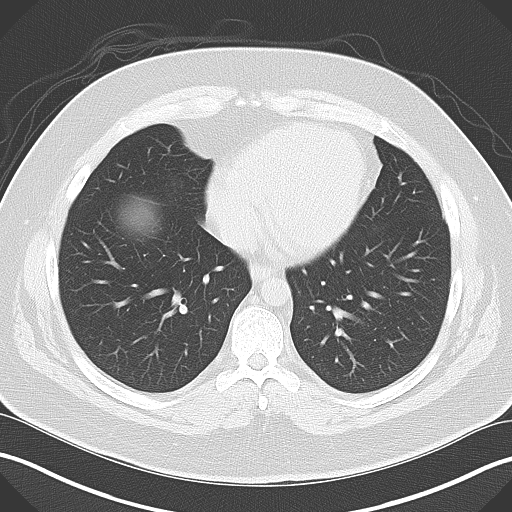
[im 20/21  soft-tissue]
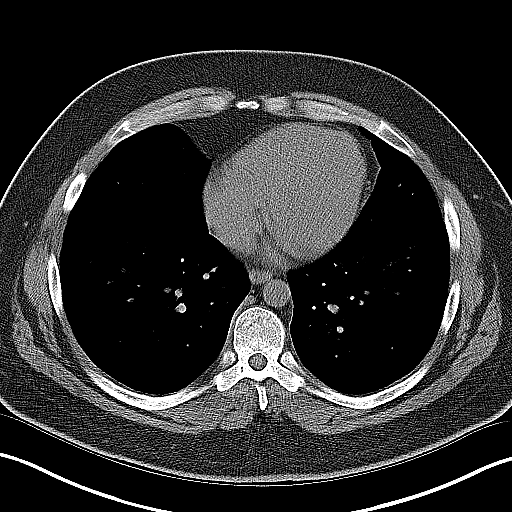
[im 20/21  lung]
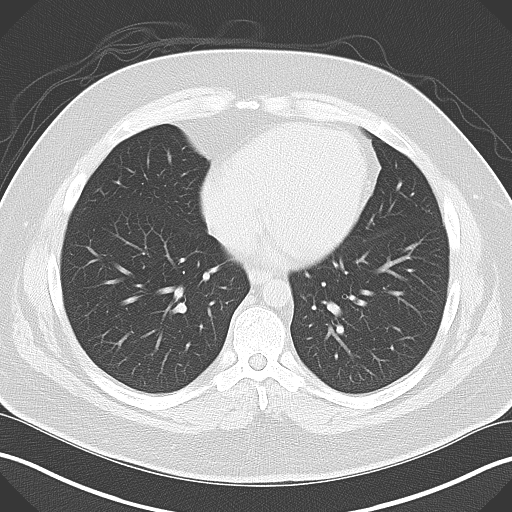

[14 of 21 positions shown; findings below may reference images not displayed]

FINDINGS: The lung bases are clear.

Multiple intrarenal stones bilaterally measuring up to about 3 mm
maximal diameter. No pyelocaliectasis or ureterectasis. No ureteral
stones or bladder stones. No bladder wall thickening.

Surgical absence of the gallbladder. Surgical clip adjacent to the
posterior liver edge inferiorly. Diffuse fatty infiltration of the
liver. The unenhanced appearance of the spleen, pancreas, adrenal
glands, abdominal aorta, inferior vena cava, and retroperitoneal
lymph nodes is unremarkable. Stomach and small bowel are
decompressed. Stool-filled colon without distention. No free air or
free fluid in the abdomen. Abdominal wall musculature appears
intact.

Pelvis: The appendix is normal. No diverticulitis. Prostate gland is
not enlarged. Bladder wall is not thickened. No free or loculated
pelvic fluid collections. Normal alignment of the lumbar spine. With
IMPRESSION: Multiple nonobstructing intrarenal stones bilaterally. No ureteral
stones. Fatty infiltration of the liver.
